# Patient Record
Sex: Male | Born: 1955 | Race: White | Hispanic: No | Marital: Married | Smoking: Former smoker
Health system: Southern US, Community
[De-identification: ages and names within clinical notes are randomized; demographics above are authoritative.]

## PROBLEM LIST (undated history)

## (undated) DIAGNOSIS — F419 Anxiety disorder, unspecified: Secondary | ICD-10-CM

## (undated) DIAGNOSIS — L980 Pyogenic granuloma: Secondary | ICD-10-CM

## (undated) DIAGNOSIS — L0390 Cellulitis, unspecified: Secondary | ICD-10-CM

## (undated) DIAGNOSIS — J189 Pneumonia, unspecified organism: Secondary | ICD-10-CM

## (undated) DIAGNOSIS — E669 Obesity, unspecified: Secondary | ICD-10-CM

## (undated) DIAGNOSIS — M1711 Unilateral primary osteoarthritis, right knee: Secondary | ICD-10-CM

## (undated) DIAGNOSIS — I10 Essential (primary) hypertension: Secondary | ICD-10-CM

## (undated) DIAGNOSIS — D649 Anemia, unspecified: Secondary | ICD-10-CM

## (undated) DIAGNOSIS — J45909 Unspecified asthma, uncomplicated: Secondary | ICD-10-CM

## (undated) DIAGNOSIS — G4730 Sleep apnea, unspecified: Secondary | ICD-10-CM

## (undated) DIAGNOSIS — R519 Headache, unspecified: Secondary | ICD-10-CM

## (undated) DIAGNOSIS — E119 Type 2 diabetes mellitus without complications: Secondary | ICD-10-CM

## (undated) DIAGNOSIS — R0600 Dyspnea, unspecified: Secondary | ICD-10-CM

## (undated) HISTORY — DX: Sleep apnea, unspecified: G47.30

## (undated) HISTORY — DX: Anxiety disorder, unspecified: F41.9

## (undated) HISTORY — DX: Cellulitis, unspecified: L03.90

---

## 1992-08-21 HISTORY — PX: COLON SURGERY: SHX602

## 2007-08-22 HISTORY — PX: HYDROCELE EXCISION / REPAIR: SUR1145

## 2007-08-22 HISTORY — PX: VASECTOMY: SHX75

## 2007-11-24 ENCOUNTER — Emergency Department: Payer: Self-pay | Admitting: Emergency Medicine

## 2007-12-17 ENCOUNTER — Ambulatory Visit: Payer: Self-pay | Admitting: Professional

## 2007-12-24 ENCOUNTER — Ambulatory Visit: Payer: Self-pay | Admitting: Professional

## 2007-12-31 ENCOUNTER — Ambulatory Visit: Payer: Self-pay | Admitting: Professional

## 2008-02-19 ENCOUNTER — Ambulatory Visit: Payer: Self-pay | Admitting: Gastroenterology

## 2008-09-25 ENCOUNTER — Inpatient Hospital Stay: Payer: Self-pay | Admitting: Internal Medicine

## 2009-11-01 ENCOUNTER — Emergency Department: Payer: Self-pay | Admitting: Unknown Physician Specialty

## 2010-09-12 ENCOUNTER — Ambulatory Visit: Payer: Self-pay | Admitting: Internal Medicine

## 2010-09-18 ENCOUNTER — Observation Stay: Payer: Self-pay | Admitting: Internal Medicine

## 2010-09-19 DIAGNOSIS — G459 Transient cerebral ischemic attack, unspecified: Secondary | ICD-10-CM

## 2011-11-09 ENCOUNTER — Observation Stay: Payer: Self-pay | Admitting: Internal Medicine

## 2011-11-09 LAB — PROTIME-INR
INR: 1
Prothrombin Time: 13.7 secs (ref 11.5–14.7)

## 2011-11-09 LAB — COMPREHENSIVE METABOLIC PANEL
Albumin: 3.4 g/dL (ref 3.4–5.0)
Alkaline Phosphatase: 63 U/L (ref 50–136)
BUN: 17 mg/dL (ref 7–18)
Bilirubin,Total: 0.3 mg/dL (ref 0.2–1.0)
Calcium, Total: 8.3 mg/dL — ABNORMAL LOW (ref 8.5–10.1)
Co2: 19 mmol/L — ABNORMAL LOW (ref 21–32)
Creatinine: 1.24 mg/dL (ref 0.60–1.30)
EGFR (Non-African Amer.): >60
Glucose: 180 mg/dL — ABNORMAL HIGH (ref 65–99)
Osmolality: 291 (ref 275–301)
SGPT (ALT): 34 U/L
Sodium: 143 mmol/L (ref 136–145)

## 2011-11-09 LAB — CBC
HCT: 42 % (ref 40.0–52.0)
HGB: 14.3 g/dL (ref 13.0–18.0)
MCH: 31.4 pg (ref 26.0–34.0)
MCHC: 34.1 g/dL (ref 32.0–36.0)
WBC: 11.9 10*3/uL — ABNORMAL HIGH (ref 3.8–10.6)

## 2011-11-09 LAB — HEMOGLOBIN A1C: Hemoglobin A1C: 6.2 % (ref 4.2–6.3)

## 2011-11-09 LAB — LIPASE, BLOOD: Lipase: 394 U/L — ABNORMAL HIGH (ref 73–393)

## 2011-11-09 LAB — HEMOGLOBIN: HGB: 12.5 g/dL — ABNORMAL LOW (ref 13.0–18.0)

## 2013-12-01 ENCOUNTER — Ambulatory Visit: Payer: Self-pay | Admitting: Gastroenterology

## 2013-12-02 LAB — PATHOLOGY REPORT

## 2014-12-13 NOTE — Discharge Summary (Signed)
PATIENT NAME:  Mathew Porter, Mathew Porter MR#:  [id number] DATE OF BIRTH:  June 25, 1956  DATE OF ADMISSION:  11/09/2011 DATE OF DISCHARGE:  11/09/2011  PRIMARY CARE PHYSICIAN: Rhona LeavensJames F. Burnett ShengHedrick, MD   DISCHARGE DIAGNOSES:   1. Rectal bleeding, most likely diverticular.  2. Hypotension, which resolved.  3. Hyperlipidemia.  4. Impaired fasting glucose.   MEDICATIONS ON DISCHARGE: Medications on discharge included his usual medications of: 1. Simvastatin 20 mg daily.  2. Verapamil ER 240 mg daily.  3. Vyvanse 20 mg daily.   NOTE: The patient was advised not to take aspirin or NSAIDs such as Advil, Motrin or Aleve.   HOSPITAL COURSE: The patient was admitted 11/09/2011. He signed out AGAINST MEDICAL ADVICE on 11/09/2011. He was admitted with rectal bleeding. Gastroenterology consultation was done by Dr. Mechele CollinElliott. The patient had Porter flexible sigmoidoscopy the same day which showed clotted blood found in the rectum, dark multiple clots, internal hemorrhoids, with Dr. Earnest ConroyElliott's advice to keep the patient overnight on Porter clear liquid diet and advance the diet slowly, watch the hemoglobin, and possibly discharge the next day in the evening. The patient did not want to do this. He wanted to leave the same day as the procedure. This was done AGAINST MEDICAL ADVICE because his hemoglobin did drop down two points. The patient understands the risks and did not want to stay any further and signed out the AGAINST MEDICAL ADVICE paperwork. He was still advised not to take aspirin or anti-inflammatories and follow up with his medical doctor.   HOSPITAL LABORATORY, DIAGNOSTIC AND RADIOLOGICAL DATA:  INR 1.0. Lipase 394. PTT 28.5.  White blood cell count 11.9, hemoglobin and hematocrit 14.3 and 42.0, platelet count of 233. Glucose 180, BUN 17, creatinine 1.24, sodium 143, potassium 4.1, chloride 110, CO2 19, calcium 8.3.  Liver function tests normal.  Chest x-ray: No acute disease in the chest.  Hemoglobin A1c 6.2. Repeat  hemoglobin 12.5.   Again, the patient signed out AGAINST MEDICAL ADVICE.   TIME SPENT ON PATIENT CARE: 30 minutes.   ____________________________ Herschell Dimesichard J. Renae GlossWieting, MD rjw:cbb D: 11/10/2011 17:40:11 ET T: 11/11/2011 16:35:38 ET JOB#: [id number]  cc: Herschell Dimesichard J. Renae GlossWieting, MD, <Dictator> Rhona LeavensJames F. Burnett ShengHedrick, MD Salley ScarletICHARD J Mayson Sterbenz MD ELECTRONICALLY SIGNED 11/15/2011 20:59

## 2014-12-13 NOTE — Consult Note (Signed)
Brief Consult Note: Diagnosis: LGI bleed painless 3 hour duration last eve after normal stools. Etiology is likely diverticular bleed, possible IH, but would be unlikely to have 3 hours of rectal bleeding from hemorrhoids, known hx of adenomatous polyps.   Patient was seen by consultant.   Consult note dictated.   Comments: DRE by me red blood formed pieces of stool, non tender anal opening.Did not penetrate far.  Non-tender abdomen. Prior colonoscopy 2009 for f/u polyps revealed multiple small mouthed diverticulosis, polyps, internal hemorrhoids.Prior colon resection for diverticulitis.  Pt wants to go home. Hgb on admission normal, repeat just drawn. Plan: NPO, Flexible sigmoidoscopy this afternoon to r/o IH vs diverticular tics as cause of bleeding. Case d/w Dr. Mechele CollinElliott in collaboration of care. Hgb returns 2 grams lower.  Electronic Signatures: Mathew Porter, Mathew Porter (NP)  (Signed 21-Mar-13 13:57)  Authored: Brief Consult Note   Last Updated: 21-Mar-13 13:57 by Mathew Porter, Tamecka Milham Porter (NP)

## 2014-12-13 NOTE — H&P (Signed)
PATIENT NAME:  Mathew Porter, Mathew Porter MR#:  [id number] DATE OF BIRTH:  Nov 09, 1955  DATE OF ADMISSION:  11/09/2011  PRIMARY CARE PHYSICIAN: Dr. Jerl Mina. ED REFERRING PHYSICIAN: Dr. Ladona Ridgel.   CHIEF COMPLAINT: Bright red blood per rectum.   HISTORY OF PRESENT ILLNESS: The patient is a 59 year old white male with previous history of having internal hemorrhoids. He reports that once a year he has bleeding by his internal hemorrhoids. This was similar to that, but it was much more severe. He reports that the bright red blood started around 7:00 p.m. yesterday evening and then continued until 10:00 p.m. He came to the ED. He otherwise has not had any abdominal pain, nausea, vomiting, or diarrhea. He denies any hematemesis. No shortness of breath. No dizziness. Otherwise, he denies no chest pains or palpitations.   PAST MEDICAL HISTORY:  1. History of diverticulitis in the past.  2. History of hyperlipidemia.  3. Hypertension.   PAST SURGICAL HISTORY: Status post bowel surgery for diverticulitis.   ALLERGIES: None.   CURRENT MEDICATIONS:  1. Vyvanse 20 mg daily.  2. Verapamil 240 daily.  3. Simvastatin 20 daily.   SOCIAL HISTORY: He smokes 1 pack per day. No alcohol. No drug use.   FAMILY HISTORY: Positive for hypertension.   REVIEW OF SYSTEMS: CONSTITUTIONAL: Denies any fevers, fatigue, weakness, pain, weight loss, or weight gain. EYES: No blurred or double vision. No pain. No redness. No inflammation. No glaucoma. ENT: No tinnitus. No ear pain. No hearing loss. No seasonal or year-round allergies. No epistaxis. No nasal discharge. No difficulty swallowing. RESPIRATORY: No cough. No wheezing. No hemoptysis. No dyspnea. No chronic obstructive pulmonary disease. No tuberculosis. No pneumonia. CARDIOVASCULAR: No chest pain. No orthopnea. No edema. No arrhythmia. No palpitations. No syncope. GASTROINTESTINAL: No nausea, vomiting, or diarrhea. No abdominal pain. No hematemesis. No ulcers. No  gastroesophageal reflux disease. No irritable bowel syndrome. No jaundice. No changes in bowel habits. GENITOURINARY: Denies any dysuria, hematuria, renal calculus, or frequency. ENDOCRINE: Denies any polydipsia, nocturia, or thyroid problems. No increase in sweating, heat or cold intolerance. HEME/LYMPH: Denies anemia, easy bruisability, or bleeding. SKIN: No acne. No rash. No changes in mole, hair or skin. MUSCULOSKELETAL: Denies any pain in the neck, back, or shoulder. NEUROLOGIC: No numbness. No weakness. No cerebrovascular accident. No transient ischemic attacks. No seizures.   PHYSICAL EXAMINATION:  VITAL SIGNS: Temperature 97, pulse 80, respirations 16, blood pressure was initially 123/72, recently was 90/52.   GENERAL: The patient is a morbidly obese male in no acute distress.   HEENT: Head atraumatic, normocephalic. Pupils equally round and reactive to light and accommodation. Extraocular movements intact. Oropharynx: Clear without any exudates.   NECK: There is no thyromegaly. No carotid bruits.   CARDIOVASCULAR: Regular rate and rhythm. No murmurs, rubs, clicks, or gallops. PMI is not displaced.   LUNGS: No accessory muscle usage. No rales, rhonchi, or wheezing.   ABDOMEN: Soft, nontender, nondistended. Positive bowel sounds x4.   EXTREMITIES: No clubbing, cyanosis, or edema.   NEUROLOGIC: Awake, alert, oriented x3. No focal deficits.   SKIN: No rash.   LYMPHATICS: No lymph nodes palpable.   VASCULAR: Good DP, PT pulses.   NEUROLOGICAL: Awake, alert, oriented x3. No focal deficits.   LABORATORY, RADIOLOGICAL AND DIAGNOSTIC DATA: Blood glucose 180, BUN 17, creatinine 1.24, sodium 143, potassium 4.1, chloride 110, CO2 19, calcium 8.3. Lipase 394. LFTs were normal. WBC slightly elevated at 11.9, hemoglobin 14.3, INR 1.07.   ASSESSMENT AND PLAN: The patient is a  59 year old white male with history of internal hemorrhoids who presents with bright red blood per rectum.  1. Bright  red blood per rectum likely due to internal hemorrhoids. Other differential diagnoses include possible diverticular bleed. At this time we will place the patient on observation as hemoglobin is currently stable. We will recheck his hemoglobin later today. We will have gastroenterology evaluate the patient. If no further bleed, likely may be discharged later today.  2. Hyperlipidemia. Continue simvastatin.  3. Hypertension, was a little hypotensive here, will hold Verapamil.  4. Hyperglycemia. We will check a hemoglobin A1c, possible new onset diabetes.  5. Leukocytosis, likely reactive in nature.  6. Slightly elevated lipase of unclear etiology. Will need to have this followed up.  7. Miscellaneous. The patient is ambulatory.    TIME SPENT: 35 minutes.   ____________________________ Lacie ScottsShreyang H. Allena KatzPatel, MD shp:ap D: 11/09/2011 05:55:47 ET T: 11/09/2011 07:13:50 ET JOB#: [id number]  cc: Jaydalyn Demattia H. Allena KatzPatel, MD, <Dictator> Rhona LeavensJames F. Burnett ShengHedrick, MD Charise CarwinSHREYANG H Tylasia Fletchall MD ELECTRONICALLY SIGNED 11/20/2011 13:24

## 2014-12-13 NOTE — Consult Note (Signed)
Flex sig done to recto-sigmoid area showing dark clots of blood scattered throughout the rectum.  This makes it likely this is diverticular bleed which appears to have stopped.  Discussed with hospitalist and patient.  He wants to leave the hospital but this may be a wrong decision and may sign out AMA.  I recommended to patient to stay to tomorrow evening.  What he will do is unknown.   Electronic Signatures: Scot JunElliott, Errin Chewning T (MD)  (Signed on 21-Mar-13 17:47)  Authored  Last Updated: 21-Mar-13 17:47 by Scot JunElliott, Vayda Dungee T (MD)

## 2014-12-13 NOTE — Consult Note (Signed)
PATIENT NAME:  Mathew CleverlyCOLLINS, Sathvik A MR#:  [id number] DATE OF BIRTH:  1955-11-18  DATE OF CONSULTATION:  11/09/2011  REFERRING PHYSICIAN:  S. Allena KatzPatel, MD   CONSULTING PHYSICIAN: Lynnae Prudeobert Elliott, MD/Adamaris King Arvilla MarketMills, ANP   REASON FOR CONSULTATION: GI bleed.   HISTORY OF PRESENT ILLNESS: The 59 year old patient with history of internal hemorrhoids, diverticulosis, adenomatous colon polyps, and previous lower gastrointestinal bleed presented to the hospital for rectal bleeding. The patient states he had a normal bowel movement yesterday morning about 7:30 in the morning. He worked, came home from work, had another normal stool at about 7:00 p.m., although he admits he did not really look at it. Twenty minutes later, he was back to the bathroom, and he says he had a diarrhea-like passage but it was just bright red blood. He reports multiple movements for about three hours. Since he decided it was not stopping, he presented to the Emergency Room; and he says he has had no further rectal bleeding. The patient denied any abdominal pain with this, nausea, vomiting, dizziness, lightheadedness, or weakness. Hemoglobin on arrival was normal. He has been eating full meals today. He says he feels fine and is looking forward to being discharged.   The patient does have a history of rectal bleeding evaluated in the 90s, had diverticulitis and bowel surgery for diverticulitis. He has also had a history of adenomatous colon polyps, and most recent colonoscopy performed in 2009 revealed diverticulosis, multiple small mouthed diverticula found in the entire colon. There was a pedunculated 4 mm polyp in the cecum and three 2 to 3 mm polyps in the distal sigmoid colon. There was a patent end-to-end colocolonic anastomosis. Internal nonbleeding small hemorrhoids were found. Pathology returned tubular adenoma and hyperplastic polyp. The patient is due for repeat surveillance colonoscopy in 2014.   PAST MEDICAL HISTORY:   1. Hypertension.  2. Diverticulosis/diverticulitis.  3. Adenomatous colon polyposis.  4. Headaches.  5. Hyperlipidemia. 6. Central obesity. 7. Obstructive sleep apnea. 8. History of depression. PAST SURGICAL HISTORY: Colon resection for diverticulitis in 1991.   MEDICATIONS ON ARRIVAL:  1. Vyvanse 20 mg daily.  2. Verapamil 240 mg daily.  3. Simvastatin 20 mg daily.   ALLERGIES: No known drug allergies.   HABITS: Positive tobacco 1 pack per day. Negative alcohol or illicit drug use.   FAMILY HISTORY: Remains negative for colorectal polyp or neoplasm.   REVIEW OF SYSTEMS: Ten systems are reviewed, negative with the exception of lower gastrointestinal bleed history as noted.   PHYSICAL EXAMINATION:  VITAL SIGNS: 97.9, heart rate 76, pulse 20, blood pressure 111/72, pulse oximetry on room air is 97%.   GENERAL: A well-appearing obese Caucasian male resting in bed looking at his IPAD.    HEENT: Head is normocephalic. Conjunctiva is pink. Sclera is anicteric. Oral mucosa is moist and intact. Tongue is coated.   NECK: Supple without lymphadenopathy. Trachea midline.   HEART: Heart tones S1, S2 without murmur or gallop.   LUNGS: Clear to auscultation. Respirations are eupneic.   ABDOMEN: Soft, nontender. Positive bowel sounds.   RECTAL: Digital rectal exam by me shows fresh red blood, there are little pieces of formed stool. No significant pain or tenderness around the anus to suggest fissure or tear. Deep penetration was not performed to examine walls.   EXTREMITIES: Lower extremities without edema, cyanosis, or clubbing.   SKIN: Warm and dry without rash.   PSYCHIATRIC: The patient is alert, oriented, interacting appropriately. He voices that he would like to be discharged  from observation bed and go home today.   NEUROLOGIC: Cranial nerves II through XII are grossly intact.   LABORATORY, DIAGNOSTIC AND RADIOLOGICAL DATA:  Admission blood work with glucose 180, BUN 17,  creatinine 1.24, lipase 394.  Hemoglobin A1c 6.2.  Liver panel normal.  WBC 11.9, hemoglobin 14.3, repeated at 12.5.  Pro time is 13.7, INR 1.0, PTT 28.5.  Portable chest x-ray, single view, performed 11/09/2010 was normal.   IMPRESSION: The patient presents with lower GI bleed, acute onset painless, etiology likely is diverticular in nature. He had a history of colon resection for diverticulitis in approximately 1991. He has had colonoscopies for adenomatous colon polyps, most recent in 2009. He had a hospital admission in 2010 for lower gastrointestinal bleed, suspected diverticular in nature. He presents now with a similar history. Hemoglobin on admission was 14.3, and on repeat now at 12.5. The patient has also had slight leukocytosis. He is asymptomatic as far as no abdominal pain, and admission lipase was performed, 394. The patient has not had any pain with this event. He presents now tolerating a regular diet and requesting discharge. The patient is expecting the etiology to be internal hemorrhoids. He reports intermittently through the years he has had this type of bright red blood. The difference is this one lasted three hours, and that was the reason he came to the hospital.   PLAN: We will make him n.p.o., Dr. Mechele Collin wants to do a flexible sigmoidoscopy this afternoon. The patient is due for a full colonoscopy next year, and that will need to be obtained at some point in time as well. I would recommend continuing to follow hemoglobin and to monitor the WBC. Through [facility] review, the patient has had leukocytosis and is to be monitored. No abdominal pain, fevers to suggest diverticulitis at this time. We will continue to follow the patient. The case was discussed with Dr. Mechele Collin in collaboration of care.       These services provided by Cala Bradford A. Arvilla Market, MS, APRN, BC, ANP, under  collaborative agreement with Scot Jun, MD.   ____________________________ Ranae Plumber Arvilla Market,  ANP kam:cbb D: 11/09/2011 14:10:51 ET T: 11/09/2011 15:47:30 ET JOB#: [id number]  cc: Cala Bradford A. Arvilla Market, ANP, <Dictator> Ranae Plumber. Suzette Battiest, MSN, ANP-BC Adult Nurse Practitioner ELECTRONICALLY SIGNED 11/21/2011 18:22

## 2015-12-21 DIAGNOSIS — Z6841 Body Mass Index (BMI) 40.0 and over, adult: Secondary | ICD-10-CM

## 2018-05-23 ENCOUNTER — Encounter: Payer: Self-pay | Admitting: Urology

## 2018-05-23 ENCOUNTER — Ambulatory Visit (INDEPENDENT_AMBULATORY_CARE_PROVIDER_SITE_OTHER): Payer: 59 | Admitting: Urology

## 2018-05-23 VITALS — BP 110/72 | HR 103 | Ht 71.0 in | Wt 330.3 lb

## 2018-05-23 DIAGNOSIS — K635 Polyp of colon: Secondary | ICD-10-CM | POA: Insufficient documentation

## 2018-05-23 DIAGNOSIS — F419 Anxiety disorder, unspecified: Secondary | ICD-10-CM | POA: Insufficient documentation

## 2018-05-23 DIAGNOSIS — N3281 Overactive bladder: Secondary | ICD-10-CM | POA: Diagnosis not present

## 2018-05-23 DIAGNOSIS — I10 Essential (primary) hypertension: Secondary | ICD-10-CM | POA: Insufficient documentation

## 2018-05-23 DIAGNOSIS — R519 Headache, unspecified: Secondary | ICD-10-CM | POA: Insufficient documentation

## 2018-05-23 DIAGNOSIS — K5790 Diverticulosis of intestine, part unspecified, without perforation or abscess without bleeding: Secondary | ICD-10-CM | POA: Insufficient documentation

## 2018-05-23 DIAGNOSIS — B019 Varicella without complication: Secondary | ICD-10-CM | POA: Insufficient documentation

## 2018-05-23 DIAGNOSIS — R51 Headache: Secondary | ICD-10-CM

## 2018-05-23 LAB — URINALYSIS, COMPLETE
BILIRUBIN UA: NEGATIVE
Glucose, UA: NEGATIVE
Ketones, UA: NEGATIVE
Leukocytes, UA: NEGATIVE
NITRITE UA: NEGATIVE
Protein, UA: NEGATIVE
RBC UA: NEGATIVE
Specific Gravity, UA: >1.03 — ABNORMAL HIGH (ref 1.005–1.030)
UUROB: 0.2 mg/dL (ref 0.2–1.0)
pH, UA: 5 (ref 5.0–7.5)

## 2018-05-23 LAB — MICROSCOPIC EXAMINATION
BACTERIA UA: NONE SEEN
Epithelial Cells (non renal): NONE SEEN /hpf (ref 0–10)
RBC MICROSCOPIC, UA: NONE SEEN /HPF (ref 0–2)
WBC, UA: NONE SEEN /hpf (ref 0–5)

## 2018-05-23 MED ORDER — OXYBUTYNIN CHLORIDE ER 10 MG PO TB24: 10.0000 mg | ORAL_TABLET | Freq: Every day | ORAL | 11 refills | Status: DC

## 2018-05-23 NOTE — Progress Notes (Signed)
05/23/2018 11:12 AM   Shellia Cleverly 1955/08/23 [id number]  Referring provider: Simonne Martinet, MD [address] [place], [place] [address]  CC: Urinary frequency  HPI: I saw Mr. Staniszewski in urology clinic today in consultation for urinary frequency from Dr. Chalmers Guest.  He is a 62 year old morbidly obese male with 60 pounds of weight gain over the last 4 to 6 months who presents with chief complaint of primarily urinary frequency, including nocturia 4-5 times per night.  He also reports mild to moderate urgency, and occasional urge incontinence.  He denies weak stream, dribbling, or feeling of incomplete emptying.  He has had difficulty with constipation lately with very hard stools.  He denies gross hematuria or flank pain.  He also has diagnosed sleep apnea, but he has not been using his CPAP machine.  He had two urine cultures this summer that were negative, however he was treated for presumed prostatitis with antibiotics.  He does not feel that the antibiotics improved his urinary symptoms.  PVR 70 cc in clinic today.  IPSS score is 11, unhappy.   PMH: Past Medical History:  Diagnosis Date  . Anxiety   . Sleep apnea     Surgical History: Past Surgical History:  Procedure Laterality Date  . COLON SURGERY  1994   Diverticulitis  . HYDROCELE EXCISION / REPAIR  2009  . VASECTOMY  2009    Allergies: No Known Allergies  Family History: Family History  Problem Relation Age of Onset  . Lung cancer Father     Social History:  reports that he quit smoking about 3 years ago. His smoking use included cigarettes. He quit after 40.00 years of use. He has never used smokeless tobacco. He reports that he drank alcohol. He reports that he does not use drugs.  ROS: Please see flowsheet from today's date for complete review of systems.  Physical Exam: BP 110/72 (BP Location: Left Arm, Patient Position: Sitting, Cuff Size: Normal)   Pulse (!) 103   Ht 5\' 11"  (1.803 m)    Wt (!) 330 lb 4.8 oz (149.8 kg)   BMI 46.07 kg/m    Constitutional: Morbidly obese, alert and oriented, No acute distress. Cardiovascular: No clubbing, cyanosis, or edema. Respiratory: Normal respiratory effort, no increased work of breathing. GI: Abdomen is soft, nontender, nondistended GU: No CVA tenderness Lymph: No cervical or inguinal lymphadenopathy. Skin: No rashes, bruises or suspicious lesions. Neurologic: Grossly intact, no focal deficits, moving all 4 extremities. Psychiatric: Normal mood and affect.  Laboratory Data: Urinalysis today 0 WBCs, 0 RBCs, no bacteria, nitrite negative  Pertinent Imaging: None to review  Assessment & Plan:   In summary, Mr. Soy is a morbidly obese 62 year old male with constipation, poorly managed sleep apnea, non-compliant with CPAP, with multiple negative urine cultures, who presents with 2 to 16-month history of urgency, frequency, and nocturia.  We had a long discussion that I feel very strongly that the symptoms are secondary to his significant weight gain, poorly managed sleep apnea, and constipation.  I strongly counseled him that behavioral modifications including weight loss, compliance with CPAP, and management of his constipation will likely improve his symptoms.  We elected to defer PSA screening today in the setting of his comorbidities.  We will also trial an anticholinergic Ditropan XL 10 mg daily for his overactive symptoms.  Trial of Ditropan XL RTC with Juanell Fairly with PVR in 3 months  [facility] [address] [place], [place] [address] [phone]  

## 2018-05-23 NOTE — Patient Instructions (Signed)

## 2018-08-26 NOTE — Progress Notes (Signed)
08/27/2018 9:33 AM   Mathew Porter 1956-01-02 [id number]  Referring provider: Jerl MinaHedrick, James, MD [address] [facility] [place], [place] [id number]  CC: Urinary frequency  HPI: Patient is a 63 year old Caucasian male with OAB who presents today for a three month follow up after a trial of oxybutynin.  Background history  Mathew Porter was this patient in consultation for urinary frequency from Mathew Porter.  He is a 63 year old morbidly obese male with 60 pounds of weight gain over the last 4 to 6 months who presents with chief complaint of primarily urinary frequency, including nocturia 4-5 times per night.  He also reports mild to moderate urgency, and occasional urge incontinence.  He denies weak stream, dribbling, or feeling of incomplete emptying.  He has had difficulty with constipation lately with very hard stools.  He denies gross hematuria or flank pain.  He also has diagnosed sleep apnea, but he has not been using his CPAP machine.  He had two urine cultures this summer that were negative, however he was treated for presumed prostatitis with antibiotics.  He does not feel that the antibiotics improved his urinary symptoms.  PVR 70 cc in clinic today.  IPSS score is 11, unhappy.  BPH WITH LUTS  (prostate and/or bladder) IPSS score:  7/4    PVR: 0 mL   Previous score: 11/5  Previous PVR: 70 mL  Major complaint(s): frequency and nocturia x several months.  He states these symptoms started suddenly.  Denies any dysuria, hematuria or suprapubic pain.   Currently taking: oxybutynin XL 10 mg daily    Denies any recent fevers, chills, nausea or vomiting.  He is drinking 3 to 4 quarts of water daily.  He has an occasional Coke once or twice a week.  No coffee.  No tea.  Glass of milk in the am.  No alcohol.  No juices.  No energy drinks.    IPSS    Row Name 08/27/18 0900         International Prostate Symptom Score   How often have you had the sensation of not emptying  your bladder?  Not at All     How often have you had to urinate less than every two hours?  About half the time     How often have you found you stopped and started again several times when you urinated?  Not at All     How often have you found it difficult to postpone urination?  Not at All     How often have you had a weak urinary stream?  Not at All     How often have you had to strain to start urination?  Not at All     How many times did you typically get up at night to urinate?  4 Times     Total IPSS Score  7       Quality of Life due to urinary symptoms   If you were to spend the rest of your life with your urinary condition just the way it is now how would you feel about that?  Mostly Disatisfied        Score:  1-7 Mild 8-19 Moderate 20-35 Severe    PMH: Past Medical History:  Diagnosis Date  . Anxiety   . Sleep apnea     Surgical History: Past Surgical History:  Procedure Laterality Date  . COLON SURGERY  1994   Diverticulitis  . HYDROCELE  EXCISION / REPAIR  2009  . VASECTOMY  2009    Allergies: No Known Allergies  Family History: Family History  Problem Relation Age of Onset  . Lung cancer Father     Social History:  reports that he quit smoking about 4 years ago. His smoking use included cigarettes. He quit after 40.00 years of use. He has never used smokeless tobacco. He reports previous alcohol use. He reports that he does not use drugs.  ROS: Please see flowsheet from today's date for complete review of systems.  Physical Exam: BP (!) 158/95   Pulse 97   Ht 5\' 11"  (1.803 m)   Wt (!) 338 lb (153.3 kg)   BMI 47.14 kg/m    Constitutional:  Well nourished. Alert and oriented, No acute distress. HEENT: [place] AT, moist mucus membranes.  Trachea midline, no masses. Cardiovascular: No clubbing, cyanosis, or edema. Respiratory: Normal respiratory effort, no increased work of breathing. GI: Abdomen is obese, with umbilical hernia.    GU: No CVA  tenderness.  No bladder fullness or masses.  Patient with uncircumcised phallus.  Foreskin easily retracted.   Urethral meatus is patent.  No penile discharge. No penile lesions or rashes. Scrotum without lesions, cysts, rashes and/or edema.  Testicles are located scrotally bilaterally. No masses are appreciated in the testicles. Left and right epididymis are normal. Rectal: Patient with  normal sphincter tone. Anus and perineum without scarring or rashes. No rectal masses are appreciated. Prostate is approximately 50  grams, could only palpate the apex and midportion, no nodules are appreciated. Skin: No rashes, bruises or suspicious lesions. Lymph: No cervical or inguinal adenopathy. Neurologic: Grossly intact, no focal deficits, moving all 4 extremities. Psychiatric: Normal mood and affect.  Pertinent Imaging: Results for SILVESTRE, WADDY (MRN [id number]) as of 08/27/2018 09:41  Ref. Range 08/27/2018 09:11  Scan Result Unknown 0    Assessment & Plan:    1. OAB - Discussed behavioral therapies, bladder training and bladder control strategies - encouraged patient to lose weight - offered referral to nutritionist but patient declined - will increase oxybutynin XL to 15 mg daily from 10 mg - RTC in 3 months for I PSS and PVR  2. Nocturia - I explained to the patient that nocturia is often multi-factorial and difficult to treat.  Sleeping disorders, heart conditions, peripheral vascular disease, diabetes, an enlarged prostate for men, an urethral stricture causing bladder outlet obstruction and/or certain medications can contribute to nocturia. - I have suggested that the patient avoid caffeine after noon and alcohol in the evening.  He or she may also benefit from fluid restrictions after 6:00 in the evening and voiding just prior to bedtime. - I have explained that research studies have showed that over 84% of patients with sleep apnea reported frequent nighttime urination.   With sleep apnea,  oxygen decreases, carbon dioxide increases, the blood become more acidic, the heart rate drops and blood vessels in the lung constrict.  The body is then alerted that something is very wrong. The sleeper must wake enough to reopen the airway. By this time, the heart is racing and experiences a false signal of fluid overload. The heart excretes a hormone-like protein that tells the body to get rid of sodium and water, resulting in nocturia. - he has a history of sleep apnea and had not slept with his CPAP in over two years - I have advised him to have a repeated sleep study and fitted with a new CPAP  3. Constipation - patient is still having issues - states he was told in was due to his large prostate - explained it is not likely do to his prostate, but likely due to his low fiber intake - encouraged the patient to eat 5 servings of non-starchy veggies daily - suggested taking MiraLAX as needed   [facility] [address], [address] [place], [place] [address] [phone]

## 2018-08-27 ENCOUNTER — Ambulatory Visit (INDEPENDENT_AMBULATORY_CARE_PROVIDER_SITE_OTHER): Payer: Managed Care, Other (non HMO) | Admitting: Urology

## 2018-08-27 ENCOUNTER — Encounter: Payer: Self-pay | Admitting: Urology

## 2018-08-27 ENCOUNTER — Other Ambulatory Visit: Payer: Self-pay

## 2018-08-27 VITALS — BP 158/95 | HR 97 | Ht 71.0 in | Wt 338.0 lb

## 2018-08-27 DIAGNOSIS — R351 Nocturia: Secondary | ICD-10-CM

## 2018-08-27 DIAGNOSIS — N3281 Overactive bladder: Secondary | ICD-10-CM

## 2018-08-27 DIAGNOSIS — K5909 Other constipation: Secondary | ICD-10-CM

## 2018-08-27 LAB — BLADDER SCAN AMB NON-IMAGING: SCAN RESULT: 0

## 2018-08-27 MED ORDER — OXYBUTYNIN CHLORIDE ER 15 MG PO TB24: 15.0000 mg | ORAL_TABLET | Freq: Every day | ORAL | 0 refills | Status: DC

## 2018-11-05 ENCOUNTER — Other Ambulatory Visit: Payer: Self-pay

## 2018-11-05 ENCOUNTER — Inpatient Hospital Stay
Admission: EM | Admit: 2018-11-05 | Discharge: 2018-11-08 | DRG: 871 | Disposition: A | Discharge status: Home / Self Care | Payer: Managed Care, Other (non HMO) | Attending: Internal Medicine | Admitting: Internal Medicine

## 2018-11-05 ENCOUNTER — Emergency Department: Payer: Managed Care, Other (non HMO)

## 2018-11-05 ENCOUNTER — Encounter: Payer: Self-pay | Admitting: Internal Medicine

## 2018-11-05 DIAGNOSIS — I10 Essential (primary) hypertension: Secondary | ICD-10-CM | POA: Diagnosis present

## 2018-11-05 DIAGNOSIS — Z6841 Body Mass Index (BMI) 40.0 and over, adult: Secondary | ICD-10-CM | POA: Diagnosis not present

## 2018-11-05 DIAGNOSIS — J209 Acute bronchitis, unspecified: Secondary | ICD-10-CM | POA: Diagnosis present

## 2018-11-05 DIAGNOSIS — G43909 Migraine, unspecified, not intractable, without status migrainosus: Secondary | ICD-10-CM | POA: Diagnosis present

## 2018-11-05 DIAGNOSIS — L03116 Cellulitis of left lower limb: Secondary | ICD-10-CM | POA: Diagnosis present

## 2018-11-05 DIAGNOSIS — Z801 Family history of malignant neoplasm of trachea, bronchus and lung: Secondary | ICD-10-CM | POA: Diagnosis not present

## 2018-11-05 DIAGNOSIS — G4730 Sleep apnea, unspecified: Secondary | ICD-10-CM | POA: Diagnosis present

## 2018-11-05 DIAGNOSIS — R0902 Hypoxemia: Secondary | ICD-10-CM

## 2018-11-05 DIAGNOSIS — Z87891 Personal history of nicotine dependence: Secondary | ICD-10-CM | POA: Diagnosis not present

## 2018-11-05 DIAGNOSIS — E119 Type 2 diabetes mellitus without complications: Secondary | ICD-10-CM | POA: Diagnosis present

## 2018-11-05 DIAGNOSIS — A419 Sepsis, unspecified organism: Secondary | ICD-10-CM

## 2018-11-05 DIAGNOSIS — R0602 Shortness of breath: Secondary | ICD-10-CM | POA: Diagnosis not present

## 2018-11-05 DIAGNOSIS — Z79899 Other long term (current) drug therapy: Secondary | ICD-10-CM

## 2018-11-05 DIAGNOSIS — J189 Pneumonia, unspecified organism: Secondary | ICD-10-CM | POA: Diagnosis not present

## 2018-11-05 HISTORY — DX: Essential (primary) hypertension: I10

## 2018-11-05 HISTORY — DX: Obesity, unspecified: E66.9

## 2018-11-05 LAB — CBC WITH DIFFERENTIAL/PLATELET
Abs Immature Granulocytes: 0.17 10*3/uL — ABNORMAL HIGH (ref 0.00–0.07)
Basophils Absolute: 0 10*3/uL (ref 0.0–0.1)
Basophils Relative: 0 %
EOS PCT: 0 %
Eosinophils Absolute: 0 10*3/uL (ref 0.0–0.5)
HCT: 44.9 % (ref 39.0–52.0)
Hemoglobin: 14.9 g/dL (ref 13.0–17.0)
Immature Granulocytes: 1 %
Lymphocytes Relative: 6 %
Lymphs Abs: 1.1 10*3/uL (ref 0.7–4.0)
MCH: 29.2 pg (ref 26.0–34.0)
MCHC: 33.2 g/dL (ref 30.0–36.0)
MCV: 88 fL (ref 80.0–100.0)
Monocytes Absolute: 0.6 10*3/uL (ref 0.1–1.0)
Monocytes Relative: 3 %
Neutro Abs: 16.9 10*3/uL — ABNORMAL HIGH (ref 1.7–7.7)
Neutrophils Relative %: 90 %
PLATELETS: 232 10*3/uL (ref 150–400)
RBC: 5.1 MIL/uL (ref 4.22–5.81)
RDW: 14.5 % (ref 11.5–15.5)
WBC: 18.8 10*3/uL — ABNORMAL HIGH (ref 4.0–10.5)
nRBC: 0 % (ref 0.0–0.2)

## 2018-11-05 LAB — PROTIME-INR
INR: 1.1 (ref 0.8–1.2)
Prothrombin Time: 13.6 seconds (ref 11.4–15.2)

## 2018-11-05 LAB — TROPONIN I: Troponin I: <0.03 ng/mL (ref <0.03)

## 2018-11-05 LAB — BASIC METABOLIC PANEL
Anion gap: 13 (ref 5–15)
BUN: 15 mg/dL (ref 8–23)
CO2: 22 mmol/L (ref 22–32)
Calcium: 8.9 mg/dL (ref 8.9–10.3)
Chloride: 100 mmol/L (ref 98–111)
Creatinine, Ser: 1.06 mg/dL (ref 0.61–1.24)
GFR calc Af Amer: >60 mL/min (ref >60)
GFR calc non Af Amer: >60 mL/min (ref >60)
Glucose, Bld: 184 mg/dL — ABNORMAL HIGH (ref 70–99)
Potassium: 3.7 mmol/L (ref 3.5–5.1)
SODIUM: 135 mmol/L (ref 135–145)

## 2018-11-05 LAB — INFLUENZA PANEL BY PCR (TYPE A & B)
Influenza A By PCR: NEGATIVE
Influenza B By PCR: NEGATIVE

## 2018-11-05 LAB — FIBRIN DERIVATIVES D-DIMER (ARMC ONLY): Fibrin derivatives D-dimer (ARMC): 832.83 ng/mL (FEU) — ABNORMAL HIGH (ref 0.00–499.00)

## 2018-11-05 LAB — LACTIC ACID, PLASMA
Lactic Acid, Venous: 2 mmol/L (ref 0.5–1.9)
Lactic Acid, Venous: 2.1 mmol/L (ref 0.5–1.9)

## 2018-11-05 MED ORDER — SODIUM CHLORIDE 0.9 % IV BOLUS
1000.0000 mL | Freq: Once | INTRAVENOUS | Status: AC
  Administered 2018-11-05: 18:00:00 1000 mL via INTRAVENOUS

## 2018-11-05 MED ORDER — ENOXAPARIN SODIUM 40 MG/0.4ML ~~LOC~~ SOLN
40.0000 mg | SUBCUTANEOUS | Status: DC
  Administered 2018-11-05 – 2018-11-06 (×2): 40 mg via SUBCUTANEOUS
  Filled 2018-11-05 (×2): qty 0.4

## 2018-11-05 MED ORDER — ASPIRIN-ACETAMINOPHEN-CAFFEINE 250-250-65 MG PO TABS
1.0000 | ORAL_TABLET | Freq: Three times a day (TID) | ORAL | Status: DC | PRN
  Administered 2018-11-05 – 2018-11-07 (×3): 1 via ORAL
  Filled 2018-11-05 (×5): qty 1

## 2018-11-05 MED ORDER — VERAPAMIL HCL ER 240 MG PO TBCR
240.0000 mg | EXTENDED_RELEASE_TABLET | Freq: Every day | ORAL | Status: DC
  Administered 2018-11-05 – 2018-11-08 (×4): 240 mg via ORAL
  Filled 2018-11-05 (×4): qty 1

## 2018-11-05 MED ORDER — VANCOMYCIN HCL 10 G IV SOLR
1250.0000 mg | Freq: Two times a day (BID) | INTRAVENOUS | Status: DC
  Administered 2018-11-05 – 2018-11-08 (×6): 1250 mg via INTRAVENOUS
  Filled 2018-11-05 (×8): qty 1250

## 2018-11-05 MED ORDER — ONDANSETRON HCL 4 MG/2ML IJ SOLN: 4.0000 mg | Freq: Four times a day (QID) | INTRAMUSCULAR | Status: DC | PRN

## 2018-11-05 MED ORDER — SODIUM CHLORIDE 0.9 % IV SOLN
2.0000 g | Freq: Once | INTRAVENOUS | Status: AC
  Administered 2018-11-05: 2 g via INTRAVENOUS
  Filled 2018-11-05: qty 20

## 2018-11-05 MED ORDER — ATORVASTATIN CALCIUM 20 MG PO TABS
10.0000 mg | ORAL_TABLET | Freq: Every day | ORAL | Status: DC
  Administered 2018-11-06 – 2018-11-08 (×3): 10 mg via ORAL
  Filled 2018-11-05 (×3): qty 1

## 2018-11-05 MED ORDER — CALCIUM CARBONATE ANTACID 500 MG PO CHEW: 1.0000 | CHEWABLE_TABLET | Freq: Four times a day (QID) | ORAL | Status: DC | PRN

## 2018-11-05 MED ORDER — ACETAMINOPHEN 650 MG RE SUPP: 650.0000 mg | Freq: Four times a day (QID) | RECTAL | Status: DC | PRN

## 2018-11-05 MED ORDER — TAMSULOSIN HCL 0.4 MG PO CAPS
0.4000 mg | ORAL_CAPSULE | Freq: Every day | ORAL | Status: DC
  Administered 2018-11-06 – 2018-11-08 (×3): 0.4 mg via ORAL
  Filled 2018-11-05 (×3): qty 1

## 2018-11-05 MED ORDER — PANTOPRAZOLE SODIUM 40 MG PO TBEC
40.0000 mg | DELAYED_RELEASE_TABLET | Freq: Every day | ORAL | Status: DC
  Administered 2018-11-05 – 2018-11-08 (×4): 40 mg via ORAL
  Filled 2018-11-05 (×4): qty 1

## 2018-11-05 MED ORDER — SIMVASTATIN 20 MG PO TABS
20.0000 mg | ORAL_TABLET | Freq: Every day | ORAL | Status: DC
  Administered 2018-11-05: 20 mg via ORAL
  Filled 2018-11-05: qty 1

## 2018-11-05 MED ORDER — OXYBUTYNIN CHLORIDE ER 5 MG PO TB24
15.0000 mg | ORAL_TABLET | Freq: Every day | ORAL | Status: DC
  Administered 2018-11-05 – 2018-11-07 (×3): 15 mg via ORAL
  Filled 2018-11-05 (×4): qty 1

## 2018-11-05 MED ORDER — VANCOMYCIN HCL IN DEXTROSE 1-5 GM/200ML-% IV SOLN
1000.0000 mg | Freq: Once | INTRAVENOUS | Status: AC
  Administered 2018-11-05: 1000 mg via INTRAVENOUS
  Filled 2018-11-05: qty 200

## 2018-11-05 MED ORDER — ALBUTEROL SULFATE (2.5 MG/3ML) 0.083% IN NEBU: 2.5000 mg | INHALATION_SOLUTION | RESPIRATORY_TRACT | Status: DC | PRN

## 2018-11-05 MED ORDER — SODIUM CHLORIDE 0.9 % IV SOLN
3.0000 g | Freq: Four times a day (QID) | INTRAVENOUS | Status: DC
  Administered 2018-11-05 – 2018-11-08 (×11): 3 g via INTRAVENOUS
  Filled 2018-11-05 (×15): qty 3

## 2018-11-05 MED ORDER — POLYETHYLENE GLYCOL 3350 17 G PO PACK: 17.0000 g | PACK | Freq: Every day | ORAL | Status: DC | PRN

## 2018-11-05 MED ORDER — TRAMADOL HCL 50 MG PO TABS
50.0000 mg | ORAL_TABLET | Freq: Four times a day (QID) | ORAL | Status: DC | PRN
  Administered 2018-11-05 – 2018-11-07 (×2): 50 mg via ORAL
  Filled 2018-11-05 (×2): qty 1

## 2018-11-05 MED ORDER — BUTALBITAL-APAP-CAFFEINE 50-325-40 MG PO TABS
1.0000 | ORAL_TABLET | ORAL | Status: DC | PRN
  Administered 2018-11-06 – 2018-11-07 (×2): 1 via ORAL
  Filled 2018-11-05 (×4): qty 1

## 2018-11-05 MED ORDER — METHYLPREDNISOLONE SODIUM SUCC 125 MG IJ SOLR
60.0000 mg | INTRAMUSCULAR | Status: DC
  Administered 2018-11-05 – 2018-11-06 (×2): 60 mg via INTRAVENOUS
  Filled 2018-11-05 (×2): qty 2

## 2018-11-05 MED ORDER — SODIUM CHLORIDE 0.9 % IV BOLUS
1000.0000 mL | Freq: Once | INTRAVENOUS | Status: AC
  Administered 2018-11-05: 1000 mL via INTRAVENOUS

## 2018-11-05 MED ORDER — ALBUTEROL SULFATE (2.5 MG/3ML) 0.083% IN NEBU: 5.0000 mg | INHALATION_SOLUTION | Freq: Once | RESPIRATORY_TRACT | Status: DC

## 2018-11-05 MED ORDER — SODIUM CHLORIDE 0.9 % IV SOLN
INTRAVENOUS | Status: DC | PRN
  Administered 2018-11-05: 20:00:00 250 mL via INTRAVENOUS
  Administered 2018-11-06: 10 mL via INTRAVENOUS
  Administered 2018-11-07: 5 mL via INTRAVENOUS
  Administered 2018-11-07 (×2): 250 mL via INTRAVENOUS

## 2018-11-05 MED ORDER — ACETAMINOPHEN 325 MG PO TABS: 650.0000 mg | ORAL_TABLET | Freq: Four times a day (QID) | ORAL | Status: DC | PRN

## 2018-11-05 MED ORDER — ONDANSETRON HCL 4 MG PO TABS: 4.0000 mg | ORAL_TABLET | Freq: Four times a day (QID) | ORAL | Status: DC | PRN

## 2018-11-05 NOTE — ED Notes (Signed)
Unable to get 2nd set of blood cultures. IV antibiotics started.

## 2018-11-05 NOTE — ED Notes (Signed)
ED Provider at bedside. 

## 2018-11-05 NOTE — ED Notes (Signed)
Date and time results received: 11/05/18 1545   Test: lactic acid Critical Value: 2.0  Name of Provider Notified: Dr Marisa Severin

## 2018-11-05 NOTE — ED Notes (Signed)
Attempted to call report to 1C. Placed on hold for 5 minutes. Will attempt to call back.

## 2018-11-05 NOTE — ED Notes (Addendum)
ED TO INPATIENT HANDOFF REPORT  ED Nurse Name and Phone #: Elijah Birk RN   [phone]  S Name/Age/Gender Mathew Porter 63 y.o. male Room/Bed: ED12A/ED12A  Code Status   Code Status: Full Code  Home/SNF/Other Home Patient oriented to: self, place, time and situation Is this baseline? Yes   Triage Complete: Triage complete  Chief Complaint sob  Triage Note Says he has chills, fever, n/v/d,aches, short of breath and headaches.  Since sun am.  Also has swollen left lower extremity and red, so clinic told him to come here due to the sob and the leg swelling because they were worried he had a clot.  He is slightly short of breath after walking in.  Able to speak in sentences.     Allergies No Known Allergies  Level of Care/Admitting Diagnosis ED Disposition    ED Disposition Condition Comment   Admit  Hospital Area: [facility] [[id number]]  Level of Care: Med-Surg [16]  Diagnosis: Left leg cellulitis [413244]  Admitting Physician: Milagros Loll [[id number]]  Attending Physician: Milagros Loll [[id number]]  Estimated length of stay: past midnight tomorrow  Certification:: I certify this patient will need inpatient services for at least 2 midnights  PT Class (Do Not Modify): Inpatient [101]  PT Acc Code (Do Not Modify): Private [1]       B Medical/Surgery History Past Medical History:  Diagnosis Date  . Anxiety   . HTN (hypertension)   . Obesity   . Sleep apnea    Past Surgical History:  Procedure Laterality Date  . COLON SURGERY  1994   Diverticulitis  . HYDROCELE EXCISION / REPAIR  2009  . VASECTOMY  2009     A IV Location/Drains/Wounds Patient Lines/Drains/Airways Status   Active Line/Drains/Airways    Name:   Placement date:   Placement time:   Site:   Days:   Peripheral IV 11/05/18 Right Antecubital   11/05/18    1146    Antecubital   less than 1   Peripheral IV 11/05/18 Left Antecubital   11/05/18    1501    Antecubital   less than 1           Intake/Output Last 24 hours  Intake/Output Summary (Last 24 hours) at 11/05/2018 1648 Last data filed at 11/05/2018 1628 Gross per 24 hour  Intake 1292.94 ml  Output -  Net 1292.94 ml    Labs/Imaging Results for orders placed or performed during the hospital encounter of 11/05/18 (from the past 48 hour(s))  Basic metabolic panel     Status: Abnormal   Collection Time: 11/05/18 11:37 AM  Result Value Ref Range   Sodium 135 135 - 145 mmol/L   Potassium 3.7 3.5 - 5.1 mmol/L   Chloride 100 98 - 111 mmol/L   CO2 22 22 - 32 mmol/L   Glucose, Bld 184 (H) 70 - 99 mg/dL   BUN 15 8 - 23 mg/dL   Creatinine, Ser 0.34 0.61 - 1.24 mg/dL   Calcium 8.9 8.9 - 74.2 mg/dL   GFR calc non Af Amer >60 >60 mL/min   GFR calc Af Amer >60 >60 mL/min   Anion gap 13 5 - 15    Comment: Performed at [facility], [address] [place], [place] [address]  CBC with Differential     Status: Abnormal   Collection Time: 11/05/18 11:37 AM  Result Value Ref Range   WBC 18.8 (H) 4.0 - 10.5 K/uL   RBC 5.10 4.22 -  5.81 MIL/uL   Hemoglobin 14.9 13.0 - 17.0 g/dL   HCT 16.1 09.6 - 04.5 %   MCV 88.0 80.0 - 100.0 fL   MCH 29.2 26.0 - 34.0 pg   MCHC 33.2 30.0 - 36.0 g/dL   RDW 40.9 81.1 - 91.4 %   Platelets 232 150 - 400 K/uL   nRBC 0.0 0.0 - 0.2 %   Neutrophils Relative % 90 %   Neutro Abs 16.9 (H) 1.7 - 7.7 K/uL   Lymphocytes Relative 6 %   Lymphs Abs 1.1 0.7 - 4.0 K/uL   Monocytes Relative 3 %   Monocytes Absolute 0.6 0.1 - 1.0 K/uL   Eosinophils Relative 0 %   Eosinophils Absolute 0.0 0.0 - 0.5 K/uL   Basophils Relative 0 %   Basophils Absolute 0.0 0.0 - 0.1 K/uL   Immature Granulocytes 1 %   Abs Immature Granulocytes 0.17 (H) 0.00 - 0.07 K/uL    Comment: Performed at [facility], [address] [place], [place] [address]  Troponin I - Once     Status: None   Collection Time: 11/05/18 11:37 AM  Result Value Ref Range   Troponin I <0.03 <0.03 ng/mL    Comment: Performed at  [facility], [address] [place], [place] [address]  Fibrin derivatives D-Dimer     Status: Abnormal   Collection Time: 11/05/18 11:37 AM  Result Value Ref Range   Fibrin derivatives D-dimer ([facility]) 832.83 (H) 0.00 - 499.00 ng/mL (FEU)    Comment: (NOTE) <> Exclusion of Venous Thromboembolism (VTE) - OUTPATIENT ONLY   (Emergency Department or [place])   0-499 ng/ml (FEU): With a low to intermediate pretest probability                      for VTE this test result excludes the diagnosis                      of VTE.   >499 ng/ml (FEU) : VTE not excluded; additional work up for VTE is                      required. <> Testing on Inpatients and Evaluation of Disseminated Intravascular   Coagulation (DIC) Reference Range:   0-499 ng/ml (FEU) Performed at [facility], [address] [place], [place] [address]   Protime-INR     Status: None   Collection Time: 11/05/18 11:37 AM  Result Value Ref Range   Prothrombin Time 13.6 11.4 - 15.2 seconds   INR 1.1 0.8 - 1.2    Comment: (NOTE) INR goal varies based on device and disease states. Performed at [facility], [address] [place], [place] [address]   Influenza panel by PCR (type A & B)     Status: None   Collection Time: 11/05/18 11:45 AM  Result Value Ref Range   Influenza A By PCR NEGATIVE NEGATIVE   Influenza B By PCR NEGATIVE NEGATIVE    Comment: (NOTE) The Xpert Xpress Flu assay is intended as an aid in the diagnosis of  influenza and should not be used as a sole basis for treatment.  This  assay is FDA approved for nasopharyngeal swab specimens only. Nasal  washings and aspirates are unacceptable for Xpert Xpress Flu testing. Performed at [facility], [address] [place], [place] [address]   Lactic acid, plasma     Status: Abnormal   Collection  Time: 11/05/18  3:03 PM  Result Value Ref Range   Lactic Acid, Venous 2.0 (HH) 0.5 - 1.9 mmol/L    Comment: CRITICAL RESULT  CALLED TO, READ BACK BY AND VERIFIED WITH TOM NEGY 11/05/18 1539 KLW Performed at [facility], [address] [place] [place], [place] [address]    Dg Chest 2 View  Result Date: 11/05/2018 CLINICAL DATA:  Shortness of breath and tachycardia EXAM: CHEST - 2 VIEW COMPARISON:  November 09, 2011 FINDINGS: There is no edema or consolidation. Heart size and pulmonary vascularity are normal. No adenopathy. No bone lesions. IMPRESSION: No edema or consolidation. Electronically Signed   By: Bretta Bang III M.D.   On: 11/05/2018 12:08   US Venous Img Lower Unilateral Left  Result Date: 11/05/2018 CLINICAL DATA:  Left leg swelling for 2 days EXAM: LEFT LOWER EXTREMITY VENOUS DOPPLER ULTRASOUND TECHNIQUE: Gray-scale sonography with graded compression, as well as color Doppler and duplex ultrasound were performed to evaluate the lower extremity deep venous systems from the level of the common femoral vein and including the common femoral, femoral, profunda femoral, popliteal and calf veins including the posterior tibial, peroneal and gastrocnemius veins when visible. The superficial great saphenous vein was also interrogated. Spectral Doppler was utilized to evaluate flow at rest and with distal augmentation maneuvers in the common femoral, femoral and popliteal veins. COMPARISON:  11/01/2009 FINDINGS: Contralateral Common Femoral Vein: Respiratory phasicity is normal and symmetric with the symptomatic side. No evidence of thrombus. Normal compressibility. Common Femoral Vein: No evidence of thrombus. Normal compressibility, respiratory phasicity and response to augmentation. Saphenofemoral Junction: No evidence of thrombus. Normal compressibility and flow on color Doppler imaging. Profunda Femoral Vein: No evidence of thrombus. Normal compressibility and flow on color Doppler imaging. Femoral Vein: No evidence of thrombus. Normal compressibility, respiratory phasicity and response to augmentation. Popliteal  Vein: No evidence of thrombus. Normal compressibility, respiratory phasicity and response to augmentation. Calf Veins: No evidence of thrombus. Normal compressibility and flow on color Doppler imaging. Superficial Great Saphenous Vein: No evidence of thrombus. Normal compressibility. Venous Reflux:  None. Other Findings:  None. IMPRESSION: No evidence of deep venous thrombosis. Electronically Signed   By: Alcide Clever M.D.   On: 11/05/2018 14:19    Pending Labs Unresulted Labs (From admission, onward)    Start     Ordered   11/12/18 0500  Creatinine, serum  (enoxaparin (LOVENOX)    CrCl >/= 30 ml/min)  Weekly,   STAT    Comments:  while on enoxaparin therapy    11/05/18 1607   11/06/18 0500  Basic metabolic panel  Tomorrow morning,   STAT     11/05/18 1607   11/06/18 0500  CBC  Tomorrow morning,   STAT     11/05/18 1607   11/05/18 1607  HIV antibody (Routine Testing)  Add-on,   AD     11/05/18 1607   11/05/18 1421  Culture, blood (routine x 2)  BLOOD CULTURE X 2,   STAT     11/05/18 1420   11/05/18 1421  Lactic acid, plasma  Now then every 2 hours,   STAT     11/05/18 1420          Vitals/Pain Today's Vitals   11/05/18 1124 11/05/18 1125 11/05/18 1141 11/05/18 1436  BP:    (!) 132/96  Pulse:   (!) 124 (!) 114  Resp:   (!) 24 (!) 21  Temp:    98.7 F (37.1 C)  TempSrc:  Oral  SpO2:   97% 97%  Weight:  (!) 142.9 kg    Height:  5\' 11"  (1.803 m)    PainSc: 5        Isolation Precautions Droplet precaution  Medications Medications  sodium chloride 0.9 % bolus 1,000 mL (has no administration in time range)  enoxaparin (LOVENOX) injection 40 mg (has no administration in time range)  acetaminophen (TYLENOL) tablet 650 mg (has no administration in time range)    Or  acetaminophen (TYLENOL) suppository 650 mg (has no administration in time range)  traMADol (ULTRAM) tablet 50 mg (has no administration in time range)  polyethylene glycol (MIRALAX / GLYCOLAX) packet 17 g (has  no administration in time range)  ondansetron (ZOFRAN) tablet 4 mg (has no administration in time range)    Or  ondansetron (ZOFRAN) injection 4 mg (has no administration in time range)  albuterol (PROVENTIL) (2.5 MG/3ML) 0.083% nebulizer solution 2.5 mg (has no administration in time range)  pantoprazole (PROTONIX) EC tablet 40 mg (has no administration in time range)  calcium carbonate (TUMS - dosed in mg elemental calcium) chewable tablet 200 mg of elemental calcium (has no administration in time range)  methylPREDNISolone sodium succinate (SOLU-MEDROL) 125 mg/2 mL injection 60 mg (has no administration in time range)  sodium chloride 0.9 % bolus 1,000 mL (0 mLs Intravenous Stopped 11/05/18 1433)  vancomycin (VANCOCIN) IVPB 1000 mg/200 mL premix (0 mg Intravenous Stopped 11/05/18 1628)  cefTRIAXone (ROCEPHIN) 2 g in sodium chloride 0.9 % 100 mL IVPB (0 g Intravenous Stopped 11/05/18 1555)    Mobility walks Low fall risk   Focused Assessments Cardiac Assessment Handoff:    Lab Results  Component Value Date   TROPONINI <0.03 11/05/2018   No results found for: DDIMER Does the Patient currently have chest pain? No  , Pulmonary Assessment Handoff:  Lung sounds:   O2 Device: Room Air        R Recommendations: See Admitting Provider Note  Report given to:  Corrie Dandy RN  Additional Notes:

## 2018-11-05 NOTE — Progress Notes (Signed)
Advance care planning  Purpose of Encounter Cellulitis and sepsis  Parties in attendance Patient  Patients Decisional capacity Patient is alert and oriented.  Able to make medical decisions.  No documented healthcare power of attorney or ACP documents.  Patient wants wife to be the healthcare power of attorney.  Discussed regarding cellulitis, sepsis, being acutely ill and needing hospitalization.  Prognosis discussed.  Treatment plan discussed.  All questions answered.  CODE STATUS discussed and patient wishes to have aggressive care with intubation and CPR if need arises.  Orders entered and CODE STATUS changed.  Full code  Time spent - 17 minutes

## 2018-11-05 NOTE — ED Notes (Signed)
2nd attempt to call report. Was told that nurse was just assigned and needed 5 minutes to review chart. Will call back in 5 min.

## 2018-11-05 NOTE — ED Triage Notes (Signed)
Says he has chills, fever, n/v/d,aches, short of breath and headaches.  Since sun am.  Also has swollen left lower extremity and red, so clinic told him to come here due to the sob and the leg swelling because they were worried he had a clot.  He is slightly short of breath after walking in.  Able to speak in sentences.

## 2018-11-05 NOTE — Consult Note (Signed)
Pharmacy Antibiotic Note  Mathew Porter is a 63 y.o. male admitted on 11/05/2018 with sepsis and cellulitis.  Pharmacy has been consulted for vancomycin and Unasyn dosing.  Plan: 1)Vancomycin 1250 mg IV Q 12 hrs after initial loading dose of 1g IV in the ED. Goal AUC 400-550. Expected AUC: 506.4 Css: 15.0 SCr used: 1.06  2) Unasyn 3 g IV every 6 hours  Height: 5\' 11"  (180.3 cm) Weight: (!) 315 lb (142.9 kg) IBW/kg (Calculated) : 75.3  Temp (24hrs), Avg:99 F (37.2 C), Min:98.3 F (36.8 C), Max:99.9 F (37.7 C)  Recent Labs  Lab 11/05/18 1137 11/05/18 1503  WBC 18.8*  --   CREATININE 1.06  --   LATICACIDVEN  --  2.0*    Estimated Creatinine Clearance: 105.9 mL/min (by C-G formula based on SCr of 1.06 mg/dL).    No Known Allergies  Antimicrobials this admission: Vancomycin 3/17 >>  Unasyn 3/17 >>  Ceftriaxone 3/17 x 1  Dose adjustments this admission:  Microbiology results: 3/17 BCx: pending 3/17 influenza: negative   Thank you for allowing pharmacy to be a part of this patient's care.  Angel Weedon A Efren Kross 11/05/2018 6:01 PM

## 2018-11-05 NOTE — ED Notes (Signed)
Responded to call bell. Pt able to ambulate to commode to urinate. Pt returned to bed, all leads reconnected. Pt resting comfortably.

## 2018-11-05 NOTE — Progress Notes (Signed)
PHARMACIST - PHYSICIAN ORDER COMMUNICATION  CONCERNING: Verapamil and Simvastatin  and risk of rhabdomyolysis  DESCRIPTION:  Patients on verapamil and simvastatin >10 mg/day have reported cases of rhabdomyolysis. Pharmacy is to assess simvastatin dose. If >10 mg, substitute atorvastatin (Lipitor) 1mg  for each 2mg  simvastatin.  This patient is ordered simvastatin 20mg  and Verapamil 240mg  CR.    ACTION TAKEN: Per protocol pharmacy has discontinued the patient's order for simvastatin and replaced it with Atorvastatin 10mg .   Gardner Candle, PharmD, BCPS Clinical Pharmacist 11/05/2018 10:38 PM

## 2018-11-05 NOTE — ED Notes (Signed)
Pt reports SoB beginning last night. Secondary complaint of pain to L lower leg. Extremity warm to touch with significant erythema around full circumference of calf and foot.

## 2018-11-05 NOTE — H&P (Addendum)
[facility] - [place] at [facility]   PATIENT NAME: Mathew Porter    MR#:  [id number]  DATE OF BIRTH:  June 15, 1956  DATE OF ADMISSION:  11/05/2018  PRIMARY CARE PHYSICIAN: Jerl Mina, MD   REQUESTING/REFERRING PHYSICIAN: Dr. Marisa Severin  CHIEF COMPLAINT:   Chief Complaint  Patient presents with  . Shortness of Breath  . Leg Swelling    HISTORY OF PRESENT ILLNESS:  Mathew Porter  is a 63 y.o. male with a known history of hypertension, obesity, sleep apnea, anxiety presents to the emergency room complaining of 3 days of not feeling well.  Patient initially have chills, sinus congestion, headache.  Then started developing fever and chills.  He noticed significant swelling and redness of his left lower extremity.  Today he has extensive redness all over his foot extending up the leg up to the knee.  Found to have lactic acid of 2, tachycardia.  He also complains of burning in his chest. Patient has no recent travel history. Works as a Magazine features editor at [employer].  PAST MEDICAL HISTORY:   Past Medical History:  Diagnosis Date  . Anxiety   . HTN (hypertension)   . Obesity   . Sleep apnea     PAST SURGICAL HISTORY:   Past Surgical History:  Procedure Laterality Date  . COLON SURGERY  1994   Diverticulitis  . HYDROCELE EXCISION / REPAIR  2009  . VASECTOMY  2009    SOCIAL HISTORY:   Social History   Tobacco Use  . Smoking status: Former Smoker    Years: 40.00    Types: Cigarettes    Last attempt to quit: 08/21/2014    Years since quitting: 4.2  . Smokeless tobacco: Never Used  Substance Use Topics  . Alcohol use: Not Currently    FAMILY HISTORY:   Family History  Problem Relation Age of Onset  . Lung cancer Father     DRUG ALLERGIES:  No Known Allergies  REVIEW OF SYSTEMS:   Review of Systems  Constitutional: Positive for malaise/fatigue. Negative for chills and fever.  HENT: Negative for sore throat.   Eyes: Negative for blurred vision,  double vision and pain.  Respiratory: Positive for cough and shortness of breath. Negative for hemoptysis and wheezing.   Cardiovascular: Positive for leg swelling. Negative for chest pain, palpitations and orthopnea.  Gastrointestinal: Negative for abdominal pain, constipation, diarrhea, heartburn, nausea and vomiting.  Genitourinary: Negative for dysuria and hematuria.  Musculoskeletal: Negative for back pain and joint pain.  Skin: Negative for rash.  Neurological: Negative for sensory change, speech change, focal weakness and headaches.  Endo/Heme/Allergies: Does not bruise/bleed easily.  Psychiatric/Behavioral: Negative for depression. The patient is not nervous/anxious.     MEDICATIONS AT HOME:   Prior to Admission medications   Medication Sig Start Date End Date Taking? Authorizing Provider  aspirin-acetaminophen-caffeine (EXCEDRIN MIGRAINE) [phone] MG tablet Take by mouth.    [provider]  levofloxacin (LEVAQUIN) 250 MG tablet  05/08/18   [provider]  oxybutynin (DITROPAN XL) 15 MG 24 hr tablet Take 1 tablet (15 mg total) by mouth at bedtime. 08/27/18   McGowan, Carollee Herter A, PA-C  simvastatin (ZOCOR) 20 MG tablet TAKE 1 TABLET BY MOUTH  NIGHTLY 10/15/17   [provider]  tamsulosin (FLOMAX) 0.4 MG CAPS capsule Take by mouth. 04/26/18   [provider]  verapamil (CALAN-SR) 240 MG CR tablet TAKE 1 TABLET BY MOUTH ONCE DAILY 10/15/17   [provider]  VITAL SIGNS:  Blood pressure (!) 132/96, pulse (!) 114, temperature 98.7 F (37.1 C), temperature source Oral, resp. rate (!) 21, height 5\' 11"  (1.803 m), weight (!) 142.9 kg, SpO2 97 %.  PHYSICAL EXAMINATION:  Physical Exam  GENERAL:  63 y.o.-year-old patient lying in the bed with no acute distress.  EYES: Pupils equal, round, reactive to light and accommodation. No scleral icterus. Extraocular muscles intact.  HEENT: Head atraumatic, normocephalic. Oropharynx and nasopharynx  clear. No oropharyngeal erythema, moist oral mucosa  NECK:  Supple, no jugular venous distention. No thyroid enlargement, no tenderness.  LUNGS: Bilateral wheezing CARDIOVASCULAR: S1, S2 tachycardic. No murmurs, rubs, or gallops.  ABDOMEN: Soft, nontender, nondistended. Bowel sounds present. No organomegaly or mass.  EXTREMITIES: No pedal edema, cyanosis, or clubbing. + 2 pedal & radial pulses b/l.   NEUROLOGIC: Cranial nerves II through XII are intact. No focal Motor or sensory deficits appreciated b/l PSYCHIATRIC: The patient is alert and oriented x 3. Good affect.  SKIN: Left lower extremity redness is circumferential all over the leg and foot.  Swelling.  No crepitus.  LABORATORY PANEL:   CBC Recent Labs  Lab 11/05/18 1137  WBC 18.8*  HGB 14.9  HCT 44.9  PLT 232   ------------------------------------------------------------------------------------------------------------------  Chemistries  Recent Labs  Lab 11/05/18 1137  NA 135  K 3.7  CL 100  CO2 22  GLUCOSE 184*  BUN 15  CREATININE 1.06  CALCIUM 8.9   ------------------------------------------------------------------------------------------------------------------  Cardiac Enzymes Recent Labs  Lab 11/05/18 1137  TROPONINI <0.03   ------------------------------------------------------------------------------------------------------------------  RADIOLOGY:  Dg Chest 2 View  Result Date: 11/05/2018 CLINICAL DATA:  Shortness of breath and tachycardia EXAM: CHEST - 2 VIEW COMPARISON:  November 09, 2011 FINDINGS: There is no edema or consolidation. Heart size and pulmonary vascularity are normal. No adenopathy. No bone lesions. IMPRESSION: No edema or consolidation. Electronically Signed   By: Bretta Bang III M.D.   On: 11/05/2018 12:08   US Venous Img Lower Unilateral Left  Result Date: 11/05/2018 CLINICAL DATA:  Left leg swelling for 2 days EXAM: LEFT LOWER EXTREMITY VENOUS DOPPLER ULTRASOUND TECHNIQUE:  Gray-scale sonography with graded compression, as well as color Doppler and duplex ultrasound were performed to evaluate the lower extremity deep venous systems from the level of the common femoral vein and including the common femoral, femoral, profunda femoral, popliteal and calf veins including the posterior tibial, peroneal and gastrocnemius veins when visible. The superficial great saphenous vein was also interrogated. Spectral Doppler was utilized to evaluate flow at rest and with distal augmentation maneuvers in the common femoral, femoral and popliteal veins. COMPARISON:  11/01/2009 FINDINGS: Contralateral Common Femoral Vein: Respiratory phasicity is normal and symmetric with the symptomatic side. No evidence of thrombus. Normal compressibility. Common Femoral Vein: No evidence of thrombus. Normal compressibility, respiratory phasicity and response to augmentation. Saphenofemoral Junction: No evidence of thrombus. Normal compressibility and flow on color Doppler imaging. Profunda Femoral Vein: No evidence of thrombus. Normal compressibility and flow on color Doppler imaging. Femoral Vein: No evidence of thrombus. Normal compressibility, respiratory phasicity and response to augmentation. Popliteal Vein: No evidence of thrombus. Normal compressibility, respiratory phasicity and response to augmentation. Calf Veins: No evidence of thrombus. Normal compressibility and flow on color Doppler imaging. Superficial Great Saphenous Vein: No evidence of thrombus. Normal compressibility. Venous Reflux:  None. Other Findings:  None. IMPRESSION: No evidence of deep venous thrombosis. Electronically Signed   By: Alcide Clever M.D.   On: 11/05/2018 14:19   IMPRESSION AND PLAN:   *  Acute left lower extremity cellulitis and sepsis with extensive redness all over the foot and leg extending up to the knee Will start vancomycin and Unasyn.  Blood cultures.  Lactic acid elevated and have ordered a bolus.  Repeat lactic acid  in 3 hours. Ultrasound shows no DVT or abscess  * Acute bronchitis with Bilateral wheezing.  Influenza checked and negative.  Will start steroids.  Nebulizers as needed.  Nothing acute on chest x-ray.  *Hypertension.  Continue home medications per.  *DVT prophylaxis with Lovenox  All the records are reviewed and case discussed with ED provider. Management plans discussed with the patient, family and they are in agreement.  CODE STATUS: Full code  TOTAL TIME TAKING CARE OF THIS PATIENT: 35 minutes.   Orie Fisherman M.D on 11/05/2018 at 4:11 PM  Between 7am to 6pm - Pager - [phone]  After 6pm go to [email or web address] - password [email or web address]  [facility]  Office  [phone]  CC: Primary care physician; Jerl Mina, MD  Note: This dictation was prepared with Dragon dictation along with smaller phrase technology. Any transcriptional errors that result from this process are unintentional.

## 2018-11-05 NOTE — ED Provider Notes (Signed)
[facility] Emergency Department Provider Note ____________________________________________   First MD Initiated Contact with Patient 11/05/18 1135     (approximate)  I have reviewed the triage vital signs and the nursing notes.   HISTORY  Chief Complaint Shortness of Breath and Leg Swelling    HPI Mathew Porter is a 63 y.o. male with PMH as noted below who presents with multiple symptoms over approximately last 2 days.  He reports shortness of breath, gradual onset, worse with exertion, not associated with chest pain or cough.  He also reports body aches, vomiting and diarrhea, as well as subjective fever and chills.  In addition, he developed swelling and pain to the left lower extremity over the last 2 days.  He denies any injuries to the leg.  No prior history of similar swelling, no prior history of DVT or PE.  Past Medical History:  Diagnosis Date  . Anxiety   . Sleep apnea     Patient Active Problem List   Diagnosis Date Noted  . Anxiety 05/23/2018  . Chickenpox 05/23/2018  . Colon polyp 05/23/2018  . Diverticulosis 05/23/2018  . Headache 05/23/2018  . Hypertension 05/23/2018  . OAB (overactive bladder) 05/23/2018  . Morbid obesity with BMI of 40.0-44.9, adult (HCC) 12/21/2015    Past Surgical History:  Procedure Laterality Date  . COLON SURGERY  1994   Diverticulitis  . HYDROCELE EXCISION / REPAIR  2009  . VASECTOMY  2009    Prior to Admission medications   Medication Sig Start Date End Date Taking? Authorizing Provider  aspirin-acetaminophen-caffeine (EXCEDRIN MIGRAINE) [phone] MG tablet Take by mouth.    [provider]  levofloxacin (LEVAQUIN) 250 MG tablet  05/08/18   [provider]  oxybutynin (DITROPAN XL) 15 MG 24 hr tablet Take 1 tablet (15 mg total) by mouth at bedtime. 08/27/18   McGowan, Carollee Herter A, PA-C  simvastatin (ZOCOR) 20 MG tablet TAKE 1 TABLET BY MOUTH  NIGHTLY 10/15/17   [provider]  tamsulosin (FLOMAX) 0.4 MG CAPS capsule Take by mouth. 04/26/18   [provider]  verapamil (CALAN-SR) 240 MG CR tablet TAKE 1 TABLET BY MOUTH ONCE DAILY 10/15/17   [provider]    Allergies Patient has no known allergies.  Family History  Problem Relation Age of Onset  . Lung cancer Father     Social History Social History   Tobacco Use  . Smoking status: Former Smoker    Years: 40.00    Types: Cigarettes    Last attempt to quit: 08/21/2014    Years since quitting: 4.2  . Smokeless tobacco: Never Used  Substance Use Topics  . Alcohol use: Not Currently  . Drug use: Never    Review of Systems  Constitutional: Positive for fever and chills. Eyes: No redness. ENT: No sore throat. Cardiovascular: Denies chest pain. Respiratory: Positive for shortness of breath. Gastrointestinal: Positive for vomiting and diarrhea.  Genitourinary: Negative for dysuria.  Musculoskeletal: Negative for back pain. Skin: Positive for rash. Neurological: Positive for headache.   ____________________________________________   PHYSICAL EXAM:  VITAL SIGNS: ED Triage Vitals  Enc Vitals Group     BP --      Pulse Rate 11/05/18 1123 (!) 132     Resp 11/05/18 1123 18     Temp 11/05/18 1123 98.3 F (36.8 C)     Temp Source 11/05/18 1123 Oral     SpO2 11/05/18 1123 94 %     Weight 11/05/18  1125 (!) 315 lb (142.9 kg)     Height 11/05/18 1125  (1.803 m)     Head Circumference --      Peak Flow --      Pain Score 11/05/18 1124 5     Pain Loc --      Pain Edu? --      Excl. in GC? --     Constitutional: Alert and oriented.  Uncomfortable appearing but in no acute distress. Eyes: Conjunctivae are normal.  Head: Atraumatic. Nose: No congestion/rhinnorhea. Mouth/Throat: Mucous membranes are slightly dry.   Neck: Normal range of motion.  Cardiovascular: Tachycardic, regular rhythm. Grossly normal heart sounds.  Good peripheral circulation. Respiratory: Normal  respiratory effort.  No retractions. Lungs CTAB. Gastrointestinal: No distention.  Musculoskeletal: Extremities warm and well perfused.  Left lower extremity with approximately 2+ edema below the knee with extensive erythema, induration, and warmth anteriorly from the ankle to just below the knee.  No streaking or rash above the knee.  Full range of motion at knee and ankle. Neurologic:  Normal speech and language. No gross focal neurologic deficits are appreciated.  Skin:  Skin is warm and dry.  Left lower extremity rash as above. Psychiatric: Mood and affect are normal. Speech and behavior are normal.  ____________________________________________   LABS (all labs ordered are listed, but only abnormal results are displayed)  Labs Reviewed  BASIC METABOLIC PANEL - Abnormal; Notable for the following components:      Result Value   Glucose, Bld 184 (*)    All other components within normal limits  CBC WITH DIFFERENTIAL/PLATELET - Abnormal; Notable for the following components:   WBC 18.8 (*)    Neutro Abs 16.9 (*)    Abs Immature Granulocytes 0.17 (*)    All other components within normal limits  FIBRIN DERIVATIVES D-DIMER ([facility] ONLY) - Abnormal; Notable for the following components:   Fibrin derivatives D-dimer ([facility]) 832.83 (*)    All other components within normal limits  LACTIC ACID, PLASMA - Abnormal; Notable for the following components:   Lactic Acid, Venous 2.0 (*)    All other components within normal limits  CULTURE, BLOOD (ROUTINE X 2)  CULTURE, BLOOD (ROUTINE X 2)  TROPONIN I  PROTIME-INR  INFLUENZA PANEL BY PCR (TYPE A & B)  LACTIC ACID, PLASMA   ____________________________________________  EKG  ED ECG REPORT I, Dionne Bucy, the attending physician, personally viewed and interpreted this ECG.  Date: 11/05/2018 EKG Time: 1128 Rate: 128 Rhythm: Sinus tachycardia QRS Axis: normal Intervals: normal ST/T Wave abnormalities: normal Narrative  Interpretation: no evidence of acute ischemia  ____________________________________________  RADIOLOGY  CXR: No focal infiltrate [place] LLE: No acute DVT  ____________________________________________   PROCEDURES  Procedure(s) performed: No  Procedures  Critical Care performed: No ____________________________________________   INITIAL IMPRESSION / ASSESSMENT AND PLAN / ED COURSE  Pertinent labs & imaging results that were available during my care of the patient were reviewed by me and considered in my medical decision making (see chart for details).  63 year old male with PMH as noted above presents with multiple symptoms over the last 2 days, with shortness of breath, subjective fever and chills, vomiting and diarrhea, as well as left lower extremity swelling and rash.  On exam the patient is relatively well-appearing overall.  He is tachycardic with otherwise normal vital signs.  His lungs are clear and he has no respiratory distress.  The left lower extremity shows significant soft tissue swelling as well as erythema,  induration, and warmth to most of the anterior lower leg.  The overall presentation is most consistent with influenza or other viral syndrome although this would not explain the left lower extremity rash.  The lower extremity appears more consistent with cellulitis, so it is also possible that the patient is having some systemic symptoms related to this infection especially given the relatively large area affected.  Differential also includes DVT/PE.  We will obtain chest x-ray and ultrasound of left lower extremity, lab work-up, give fluids, and reassess.  ----------------------------------------- 3:41 PM on 11/05/2018 -----------------------------------------  Ultrasound shows no evidence of DVT.  Chest x-ray and influenza are negative.  The patient has an elevated WBC count and lactic acid.  Overall the presentation is most consistent with cellulitis, with the  patient's tachycardia, myalgias, and fever being systemic symptoms from this.  I ordered empiric antibiotics for cellulitis.  I signed the patient out to the hospitalist Dr. Elpidio Anis. ____________________________________________   FINAL CLINICAL IMPRESSION(S) / ED DIAGNOSES  Final diagnoses:  Cellulitis of left lower extremity  Sepsis without acute organ dysfunction, due to unspecified organism [facility])      NEW MEDICATIONS STARTED DURING THIS VISIT:  New Prescriptions   No medications on file     Note:  This document was prepared using Dragon voice recognition software and may include unintentional dictation errors.    Dionne Bucy, MD 11/05/18 [phone]

## 2018-11-05 NOTE — ED Notes (Signed)
Unsuccessful after 2 attempts to draw blood cultures/ lactic acid. Jeannett Senior RN contacted. Will try to collect samples.

## 2018-11-06 LAB — BASIC METABOLIC PANEL
Anion gap: 12 (ref 5–15)
BUN: 13 mg/dL (ref 8–23)
CO2: 21 mmol/L — AB (ref 22–32)
Calcium: 8 mg/dL — ABNORMAL LOW (ref 8.9–10.3)
Chloride: 101 mmol/L (ref 98–111)
Creatinine, Ser: 0.91 mg/dL (ref 0.61–1.24)
GFR calc Af Amer: >60 mL/min (ref >60)
GFR calc non Af Amer: >60 mL/min (ref >60)
Glucose, Bld: 226 mg/dL — ABNORMAL HIGH (ref 70–99)
Potassium: 3.4 mmol/L — ABNORMAL LOW (ref 3.5–5.1)
Sodium: 134 mmol/L — ABNORMAL LOW (ref 135–145)

## 2018-11-06 LAB — CBC
HCT: 39.8 % (ref 39.0–52.0)
Hemoglobin: 13.1 g/dL (ref 13.0–17.0)
MCH: 28.9 pg (ref 26.0–34.0)
MCHC: 32.9 g/dL (ref 30.0–36.0)
MCV: 87.7 fL (ref 80.0–100.0)
Platelets: 212 10*3/uL (ref 150–400)
RBC: 4.54 MIL/uL (ref 4.22–5.81)
RDW: 14.9 % (ref 11.5–15.5)
WBC: 19 10*3/uL — ABNORMAL HIGH (ref 4.0–10.5)
nRBC: 0 % (ref 0.0–0.2)

## 2018-11-06 MED ORDER — POTASSIUM CHLORIDE CRYS ER 20 MEQ PO TBCR
40.0000 meq | EXTENDED_RELEASE_TABLET | Freq: Once | ORAL | Status: AC
  Administered 2018-11-06: 40 meq via ORAL
  Filled 2018-11-06: qty 2

## 2018-11-06 MED ORDER — SUMATRIPTAN SUCCINATE 50 MG PO TABS
50.0000 mg | ORAL_TABLET | Freq: Three times a day (TID) | ORAL | Status: DC | PRN
  Administered 2018-11-06 – 2018-11-08 (×2): 50 mg via ORAL
  Filled 2018-11-06 (×4): qty 1

## 2018-11-06 NOTE — Progress Notes (Signed)
Inpatient Diabetes Program Recommendations  AACE/ADA: New Consensus Statement on Inpatient Glycemic Control (2015)  Target Ranges:  Prepandial:   less than 140 mg/dL      Peak postprandial:   less than 180 mg/dL (1-2 hours)      Critically ill patients:  140 - 180 mg/dL   Lab Results  Component Value Date   HGBA1C 6.2 11/09/2011    Review of Glycemic Control Results for Mathew Porter, Mathew Porter (MRN [id number]) as of 11/06/2018 12:12  Ref. Range 11/06/2018 05:10  Glucose Latest Ref Range: 70 - 99 mg/dL 449 (H)   Diabetes history: None Inpatient Diabetes Program Recommendations:   No previous history of DM noted. Patient is currently on IV steroids once daily. Lab glucose>200 mg/dL.  Consider adding A1C to current labs.  Also may consider adding CBG's tid with meals and HS with Novolog moderate correction.   Thanks,  Beryl Meager, RN, BC-ADM Inpatient Diabetes Coordinator Pager [phone] (8a-5p)

## 2018-11-06 NOTE — Progress Notes (Signed)
[facility]   PATIENT NAME: Mathew Porter    MR#:  [id number]  DATE OF BIRTH:  30-Sep-1955  SUBJECTIVE:  CHIEF COMPLAINT:   Chief Complaint  Patient presents with  . Shortness of Breath  . Leg Swelling   Came with left leg swelling and redness.  Admitted for cellulitis the swelling is slightly better today.  He has complain of severe headache and says that he has complaints of migraine chronically.  REVIEW OF SYSTEMS:  CONSTITUTIONAL: No fever, fatigue or weakness.  EYES: No blurred or double vision.  EARS, NOSE, AND THROAT: No tinnitus or ear pain.  RESPIRATORY: No cough, shortness of breath, wheezing or hemoptysis.  CARDIOVASCULAR: No chest pain, orthopnea, edema.  GASTROINTESTINAL: No nausea, vomiting, diarrhea or abdominal pain.  GENITOURINARY: No dysuria, hematuria.  ENDOCRINE: No polyuria, nocturia,  HEMATOLOGY: No anemia, easy bruising or bleeding SKIN: No rash or lesion. MUSCULOSKELETAL: No joint pain or arthritis.   NEUROLOGIC: No tingling, numbness, weakness.  PSYCHIATRY: No anxiety or depression.   ROS  DRUG ALLERGIES:  No Known Allergies  VITALS:  Blood pressure (!) 113/54, pulse 71, temperature 98.1 F (36.7 C), temperature source Oral, resp. rate 18, height 5\' 11"  (1.803 m), weight (!) 142.9 kg, SpO2 94 %.  PHYSICAL EXAMINATION:  GENERAL:  63 y.o.-year-old obese patient in the bed with no acute distress.  EYES: Pupils equal, round, reactive to light and accommodation. No scleral icterus. Extraocular muscles intact.  HEENT: Head atraumatic, normocephalic. Oropharynx and nasopharynx clear.  NECK:  Supple, no jugular venous distention. No thyroid enlargement, no tenderness.  LUNGS: Normal breath sounds bilaterally, no wheezing, rales,rhonchi or crepitation. No use of accessory muscles of respiration.  CARDIOVASCULAR: S1, S2 normal. No murmurs, rubs, or gallops.  ABDOMEN: Soft, nontender, nondistended. Bowel sounds  present. No organomegaly or mass.  EXTREMITIES: Left leg and foot is swollen and red with some blisters oozing clear liquid.  NEUROLOGIC: Cranial nerves II through XII are intact. Muscle strength 5/5 in all extremities. Sensation intact. Gait not checked.  PSYCHIATRIC: The patient is alert and oriented x 3.  SKIN: No obvious rash, lesion, or ulcer.   Physical Exam LABORATORY PANEL:   CBC Recent Labs  Lab 11/06/18 0510  WBC 19.0*  HGB 13.1  HCT 39.8  PLT 212   ------------------------------------------------------------------------------------------------------------------  Chemistries  Recent Labs  Lab 11/06/18 0510  NA 134*  K 3.4*  CL 101  CO2 21*  GLUCOSE 226*  BUN 13  CREATININE 0.91  CALCIUM 8.0*   ------------------------------------------------------------------------------------------------------------------  Cardiac Enzymes Recent Labs  Lab 11/05/18 1137  TROPONINI <0.03   ------------------------------------------------------------------------------------------------------------------  RADIOLOGY:  Dg Chest 2 View  Result Date: 11/05/2018 CLINICAL DATA:  Shortness of breath and tachycardia EXAM: CHEST - 2 VIEW COMPARISON:  November 09, 2011 FINDINGS: There is no edema or consolidation. Heart size and pulmonary vascularity are normal. No adenopathy. No bone lesions. IMPRESSION: No edema or consolidation. Electronically Signed   By: Bretta Bang III M.D.   On: 11/05/2018 12:08   US Venous Img Lower Unilateral Left  Result Date: 11/05/2018 CLINICAL DATA:  Left leg swelling for 2 days EXAM: LEFT LOWER EXTREMITY VENOUS DOPPLER ULTRASOUND TECHNIQUE: Gray-scale sonography with graded compression, as well as color Doppler and duplex ultrasound were performed to evaluate the lower extremity deep venous systems from the level of the common femoral vein and including the common femoral, femoral, profunda femoral, popliteal and calf veins including the posterior  tibial, peroneal and gastrocnemius  veins when visible. The superficial great saphenous vein was also interrogated. Spectral Doppler was utilized to evaluate flow at rest and with distal augmentation maneuvers in the common femoral, femoral and popliteal veins. COMPARISON:  11/01/2009 FINDINGS: Contralateral Common Femoral Vein: Respiratory phasicity is normal and symmetric with the symptomatic side. No evidence of thrombus. Normal compressibility. Common Femoral Vein: No evidence of thrombus. Normal compressibility, respiratory phasicity and response to augmentation. Saphenofemoral Junction: No evidence of thrombus. Normal compressibility and flow on color Doppler imaging. Profunda Femoral Vein: No evidence of thrombus. Normal compressibility and flow on color Doppler imaging. Femoral Vein: No evidence of thrombus. Normal compressibility, respiratory phasicity and response to augmentation. Popliteal Vein: No evidence of thrombus. Normal compressibility, respiratory phasicity and response to augmentation. Calf Veins: No evidence of thrombus. Normal compressibility and flow on color Doppler imaging. Superficial Great Saphenous Vein: No evidence of thrombus. Normal compressibility. Venous Reflux:  None. Other Findings:  None. IMPRESSION: No evidence of deep venous thrombosis. Electronically Signed   By: Alcide Clever M.D.   On: 11/05/2018 14:19    ASSESSMENT AND PLAN:   Active Problems:   Left leg cellulitis  *Acute left lower extremity cellulitis and sepsis with extensive redness all over the foot and leg extending up to the knee vancomycin and Unasyn.  Blood cultures.  Lactic acid elevated and have ordered a bolus.  Ultrasound shows no DVT or abscess  * Acute bronchitis with Bilateral wheezing.  Influenza checked and negative.  steroids.  Nebulizers as needed.  Nothing acute on chest x-ray. Feels better now.  *Migraine headache Fioricet is not helping much, give Imitrex.  *Hypertension.  Continue  home medications per.  *DVT prophylaxis with Lovenox    All the records are reviewed and case discussed with Care Management/Social Workerr. Management plans discussed with the patient, family and they are in agreement.  CODE STATUS: Full code  TOTAL TIME TAKING CARE OF THIS PATIENT: 35 minutes.     POSSIBLE D/C IN 1-2 DAYS, DEPENDING ON CLINICAL CONDITION.   Altamese Dilling M.D on 11/06/2018   Between 7am to 6pm - Pager - [phone]  After 6pm go to [email or web address] - password [email or web address]  [facility]  Office  [phone]  CC: Primary care physician; Jerl Mina, MD  Note: This dictation was prepared with Dragon dictation along with smaller phrase technology. Any transcriptional errors that result from this process are unintentional.

## 2018-11-07 ENCOUNTER — Inpatient Hospital Stay: Payer: Managed Care, Other (non HMO)

## 2018-11-07 LAB — BASIC METABOLIC PANEL
Anion gap: 9 (ref 5–15)
BUN: 17 mg/dL (ref 8–23)
CO2: 23 mmol/L (ref 22–32)
Calcium: 7.8 mg/dL — ABNORMAL LOW (ref 8.9–10.3)
Chloride: 101 mmol/L (ref 98–111)
Creatinine, Ser: 0.95 mg/dL (ref 0.61–1.24)
GFR calc Af Amer: >60 mL/min (ref >60)
GFR calc non Af Amer: >60 mL/min (ref >60)
GLUCOSE: 253 mg/dL — AB (ref 70–99)
Potassium: 3.8 mmol/L (ref 3.5–5.1)
Sodium: 133 mmol/L — ABNORMAL LOW (ref 135–145)

## 2018-11-07 LAB — GLUCOSE, CAPILLARY
Glucose-Capillary: 209 mg/dL — ABNORMAL HIGH (ref 70–99)
Glucose-Capillary: 257 mg/dL — ABNORMAL HIGH (ref 70–99)
Glucose-Capillary: 219 mg/dL — ABNORMAL HIGH (ref 70–99)
Glucose-Capillary: 165 mg/dL — ABNORMAL HIGH (ref 70–99)

## 2018-11-07 LAB — PROCALCITONIN: Procalcitonin: 1.71 ng/mL

## 2018-11-07 LAB — CBC
HCT: 37.5 % — ABNORMAL LOW (ref 39.0–52.0)
Hemoglobin: 12.3 g/dL — ABNORMAL LOW (ref 13.0–17.0)
MCH: 28.3 pg (ref 26.0–34.0)
MCHC: 32.8 g/dL (ref 30.0–36.0)
MCV: 86.4 fL (ref 80.0–100.0)
Platelets: 233 10*3/uL (ref 150–400)
RBC: 4.34 MIL/uL (ref 4.22–5.81)
RDW: 15 % (ref 11.5–15.5)
WBC: 21.1 10*3/uL — ABNORMAL HIGH (ref 4.0–10.5)
nRBC: 0 % (ref 0.0–0.2)

## 2018-11-07 LAB — HEMOGLOBIN A1C
HEMOGLOBIN A1C: 7.1 % — AB (ref 4.8–5.6)
Mean Plasma Glucose: 157.07 mg/dL

## 2018-11-07 LAB — HIV ANTIBODY (ROUTINE TESTING W REFLEX): HIV Screen 4th Generation wRfx: NONREACTIVE

## 2018-11-07 LAB — MRSA PCR SCREENING: MRSA by PCR: NEGATIVE

## 2018-11-07 MED ORDER — ENOXAPARIN SODIUM 40 MG/0.4ML ~~LOC~~ SOLN
40.0000 mg | Freq: Two times a day (BID) | SUBCUTANEOUS | Status: DC
  Administered 2018-11-07 – 2018-11-08 (×3): 40 mg via SUBCUTANEOUS
  Filled 2018-11-07 (×3): qty 0.4

## 2018-11-07 MED ORDER — SODIUM CHLORIDE 0.9 % IV SOLN
INTRAVENOUS | Status: DC | PRN
  Administered 2018-11-07: 18:00:00 via INTRAVENOUS

## 2018-11-07 MED ORDER — TRAZODONE HCL 50 MG PO TABS
50.0000 mg | ORAL_TABLET | Freq: Every evening | ORAL | Status: DC | PRN
  Administered 2018-11-07 (×2): 50 mg via ORAL
  Filled 2018-11-07 (×2): qty 1

## 2018-11-07 MED ORDER — INSULIN ASPART 100 UNIT/ML ~~LOC~~ SOLN
0.0000 [IU] | Freq: Three times a day (TID) | SUBCUTANEOUS | Status: DC
  Administered 2018-11-07 (×2): 5 [IU] via SUBCUTANEOUS
  Administered 2018-11-07: 8 [IU] via SUBCUTANEOUS
  Administered 2018-11-08: 12:00:00 2 [IU] via SUBCUTANEOUS
  Administered 2018-11-08 (×2): 3 [IU] via SUBCUTANEOUS
  Filled 2018-11-07 (×6): qty 1

## 2018-11-07 MED ORDER — LIVING WELL WITH DIABETES BOOK
Freq: Once | Status: AC
  Administered 2018-11-07: 18:00:00

## 2018-11-07 MED ORDER — INSULIN ASPART 100 UNIT/ML ~~LOC~~ SOLN: 0.0000 [IU] | Freq: Every day | SUBCUTANEOUS | Status: DC

## 2018-11-07 NOTE — Progress Notes (Signed)
Inpatient Diabetes Program Recommendations  AACE/ADA: New Consensus Statement on Inpatient Glycemic Control (2015)  Target Ranges:  Prepandial:   less than 140 mg/dL      Peak postprandial:   less than 180 mg/dL (1-2 hours)      Critically ill patients:  140 - 180 mg/dL   Lab Results  Component Value Date   GLUCAP 257 (H) 11/07/2018   HGBA1C 7.1 (H) 11/07/2018    Results for Mathew Porter, Mathew Porter (MRN [id number]) as of 11/07/2018 16:23  Ref. Range 11/07/2018 08:11 11/07/2018 12:05  Glucose-Capillary Latest Ref Range: 70 - 99 mg/dL 784 (H) 696 (H)    Diabetes history: None Outpatient Diabetes medications: None Current orders for Inpatient glycemic control:  Novolog moderate tid with meals and HS  Inpatient Diabetes Program Recommendations:    Note elevated A1C with no history of DM.  Discussed A1C results with patient and standards of care related to DM.  Patient was unaware of elevated A1C and surprised.  Encouraged f/u with PCP regarding elevated A1C.  Blood sugars likely higher due to steroids that were given on 11/06/18. We briefly discussed lifestyle modifications such as eliminating sugar from beverages, limiting portions, and exercise.  Also ordered Living well with DM booklet for patient.    - MD at d/c may consider adding Metformin 500 mg bid and have patient f/u with PCP ASAP.    Thanks,  Beryl Meager, RN, BC-ADM Inpatient Diabetes Coordinator Pager [phone] (8a-5p)

## 2018-11-07 NOTE — Progress Notes (Signed)
[employer] [facility] [place] [facility]   PATIENT NAME: Mathew Porter    MR#:  [id number]  DATE OF BIRTH:  Aug 14, 1956  SUBJECTIVE:  CHIEF COMPLAINT:   Chief Complaint  Patient presents with  . Shortness of Breath  . Leg Swelling   Came with left leg swelling and redness.  Admitted for cellulitis the swelling is slightly better today.  He has complain of severe headache and says that he has complaints of migraine chronically.  headache is better now, no SOB. Leg swelling is some better.  REVIEW OF SYSTEMS:  CONSTITUTIONAL: No fever, fatigue or weakness.  EYES: No blurred or double vision.  EARS, NOSE, AND THROAT: No tinnitus or ear pain.  RESPIRATORY: No cough, shortness of breath, wheezing or hemoptysis.  CARDIOVASCULAR: No chest pain, orthopnea, edema.  GASTROINTESTINAL: No nausea, vomiting, diarrhea or abdominal pain.  GENITOURINARY: No dysuria, hematuria.  ENDOCRINE: No polyuria, nocturia,  HEMATOLOGY: No anemia, easy bruising or bleeding SKIN: No rash or lesion. MUSCULOSKELETAL: No joint pain or arthritis.   NEUROLOGIC: No tingling, numbness, weakness.  PSYCHIATRY: No anxiety or depression.   ROS  DRUG ALLERGIES:  No Known Allergies  VITALS:  Blood pressure (!) 147/80, pulse 95, temperature 98.1 F (36.7 C), temperature source Oral, resp. rate 18, height 5\' 11"  (1.803 m), weight (!) 156.1 kg, SpO2 95 %.  PHYSICAL EXAMINATION:  GENERAL:  63 y.o.-year-old obese patient in the bed with no acute distress.  EYES: Pupils equal, round, reactive to light and accommodation. No scleral icterus. Extraocular muscles intact.  HEENT: Head atraumatic, normocephalic. Oropharynx and nasopharynx clear.  NECK:  Supple, no jugular venous distention. No thyroid enlargement, no tenderness.  LUNGS: Normal breath sounds bilaterally, no wheezing, rales,rhonchi or crepitation. No use of accessory muscles of respiration.  CARDIOVASCULAR: S1, S2 normal. No murmurs, rubs, or  gallops.  ABDOMEN: Soft, nontender, nondistended. Bowel sounds present. No organomegaly or mass.  EXTREMITIES: Left leg and foot is swollen and red with some blisters oozing clear liquid.  NEUROLOGIC: Cranial nerves II through XII are intact. Muscle strength 5/5 in all extremities. Sensation intact. Gait not checked.  PSYCHIATRIC: The patient is alert and oriented x 3.  SKIN: No obvious rash, lesion, or ulcer.   Physical Exam LABORATORY PANEL:   CBC Recent Labs  Lab 11/07/18 0417  WBC 21.1*  HGB 12.3*  HCT 37.5*  PLT 233   ------------------------------------------------------------------------------------------------------------------  Chemistries  Recent Labs  Lab 11/07/18 0417  NA 133*  K 3.8  CL 101  CO2 23  GLUCOSE 253*  BUN 17  CREATININE 0.95  CALCIUM 7.8*   ------------------------------------------------------------------------------------------------------------------  Cardiac Enzymes Recent Labs  Lab 11/05/18 1137  TROPONINI <0.03   ------------------------------------------------------------------------------------------------------------------  RADIOLOGY:  Dg Chest 2 View  Result Date: 11/07/2018 CLINICAL DATA:  63 year old with hypoxia. EXAM: CHEST - 2 VIEW COMPARISON:  11/05/2018 and 11/09/2011 FINDINGS: New patchy densities in the mid and lower right lung are concerning for new airspace disease and pneumonia. Questionable rounded opacity in the peripheral mid/upper aspect of the right lung. Similarly, there is a questionable rounded opacity along the periphery of the left lower lung. These rounded opacities could be related to overlying soft tissue but nonspecific. Concern for opacity in superior segment of the right lower lobe based on the lateral view. No large pleural effusions. Heart size is stable. Trachea is midline. IMPRESSION: New patchy densities in the right lower lung and questionable bilateral peripheral rounded opacities as described.  Findings are concerning for pneumonia. Rounded  opacities are more indeterminate and could be related to overlying soft tissues. Recommend continued follow-up. Electronically Signed   By: Richarda Overlie M.D.   On: 11/07/2018 09:26    ASSESSMENT AND PLAN:   Active Problems:   Left leg cellulitis  *Acute left lower extremity cellulitis and sepsis with extensive redness all over the foot and leg extending up to the knee vancomycin and Unasyn.  Blood cultures.  Lactic acid elevated and have ordered a bolus.  Ultrasound shows no DVT or abscess We will get his leg wrapped with elastic bandage to help some reduction in the swelling.  * Community-acquired pneumonia- bilateral, Influenza checked and negative.  steroids.  Nebulizers as needed.    Chest x-ray was clear on admission but now shows developing pneumonia bilateral. Feels better now.  He is not short of breath and oxygen saturation is stable. Procalcitonin is high.  He is already on Vanco and Unasyn. I will check MRSA PCR.  *Migraine headache Fioricet is not helping much, give Imitrex. Headache resolved with Imitrex.  *Hypertension.  Continue home medications per.  *DVT prophylaxis with Lovenox    All the records are reviewed and case discussed with Care Management/Social Workerr. Management plans discussed with the patient, family and they are in agreement.  CODE STATUS: Full code  TOTAL TIME TAKING CARE OF THIS PATIENT: 35 minutes.     POSSIBLE D/C IN 1-2 DAYS, DEPENDING ON CLINICAL CONDITION.   Altamese Dilling M.D on 11/07/2018   Between 7am to 6pm - Pager - [phone]  After 6pm go to [email or web address] - password [email or web address]  [facility]  Office  [phone]  CC: Primary care physician; Mathew Mina, MD  Note: This dictation was prepared with Dragon dictation along with smaller phrase technology. Any transcriptional errors that result from this process are unintentional.

## 2018-11-07 NOTE — Progress Notes (Signed)
Pt was short of breath during night with an oxygen level of 92%. Put patient on 2L and O2 went up to 95%. Will continue to monitor.

## 2018-11-07 NOTE — Progress Notes (Signed)
PHARMACIST - PHYSICIAN COMMUNICATION  CONCERNING:  Enoxaparin (Lovenox) for DVT Prophylaxis   RECOMMENDATION: Patient was prescribed enoxaprin 40mg  q24 hours for VTE prophylaxis.   Filed Weights   11/05/18 1125 11/07/18 0411  Weight: (!) 315 lb (142.9 kg) (!) 344 lb 2.2 oz (156.1 kg)    Body mass index is 48 kg/m.  Estimated Creatinine Clearance: 124.3 mL/min (by C-G formula based on SCr of 0.95 mg/dL).  Based on [facility] policy patient is candidate for enoxaparin 40mg  every 12 hour dosing due to BMI being >40.  DESCRIPTION: Pharmacy has adjusted enoxaparin dose per [place]/[facility] policy.  Patient is now receiving enoxaparin 40mg  every 12 hours.   Gardner Candle, PharmD, BCPS Clinical Pharmacist 11/07/2018 6:15 AM

## 2018-11-07 NOTE — Progress Notes (Signed)
Care of patient taken over from [place], [place].  Patient resting comfortably in bed.  Orson Ape, BSN

## 2018-11-08 LAB — BASIC METABOLIC PANEL
Anion gap: 8 (ref 5–15)
BUN: 16 mg/dL (ref 8–23)
CO2: 24 mmol/L (ref 22–32)
Calcium: 7.8 mg/dL — ABNORMAL LOW (ref 8.9–10.3)
Chloride: 102 mmol/L (ref 98–111)
Creatinine, Ser: 0.79 mg/dL (ref 0.61–1.24)
GFR calc Af Amer: >60 mL/min (ref >60)
GFR calc non Af Amer: >60 mL/min (ref >60)
Glucose, Bld: 171 mg/dL — ABNORMAL HIGH (ref 70–99)
Potassium: 4.9 mmol/L (ref 3.5–5.1)
SODIUM: 134 mmol/L — AB (ref 135–145)

## 2018-11-08 LAB — CBC
HCT: 40.1 % (ref 39.0–52.0)
Hemoglobin: 13.3 g/dL (ref 13.0–17.0)
MCH: 29.2 pg (ref 26.0–34.0)
MCHC: 33.2 g/dL (ref 30.0–36.0)
MCV: 87.9 fL (ref 80.0–100.0)
Platelets: 256 10*3/uL (ref 150–400)
RBC: 4.56 MIL/uL (ref 4.22–5.81)
RDW: 15.9 % — ABNORMAL HIGH (ref 11.5–15.5)
WBC: 16.5 10*3/uL — ABNORMAL HIGH (ref 4.0–10.5)
nRBC: 0 % (ref 0.0–0.2)

## 2018-11-08 LAB — GLUCOSE, CAPILLARY
Glucose-Capillary: 155 mg/dL — ABNORMAL HIGH (ref 70–99)
Glucose-Capillary: 145 mg/dL — ABNORMAL HIGH (ref 70–99)
Glucose-Capillary: 155 mg/dL — ABNORMAL HIGH (ref 70–99)

## 2018-11-08 LAB — PROCALCITONIN: Procalcitonin: 1.2 ng/mL

## 2018-11-08 MED ORDER — SUMATRIPTAN SUCCINATE 50 MG PO TABS: 50.0000 mg | ORAL_TABLET | Freq: Three times a day (TID) | ORAL | 0 refills | Status: AC | PRN

## 2018-11-08 MED ORDER — PANTOPRAZOLE SODIUM 40 MG PO TBEC: 40.0000 mg | DELAYED_RELEASE_TABLET | Freq: Every day | ORAL | 0 refills | Status: DC

## 2018-11-08 MED ORDER — METFORMIN HCL 500 MG PO TABS: 500.0000 mg | ORAL_TABLET | Freq: Two times a day (BID) | ORAL | 0 refills | Status: DC

## 2018-11-08 MED ORDER — DOXYCYCLINE HYCLATE 100 MG PO CAPS: 100.0000 mg | ORAL_CAPSULE | Freq: Two times a day (BID) | ORAL | 0 refills | Status: AC

## 2018-11-08 MED ORDER — AMOXICILLIN-POT CLAVULANATE 875-125 MG PO TABS: 1.0000 | ORAL_TABLET | Freq: Two times a day (BID) | ORAL | 0 refills | Status: AC

## 2018-11-08 NOTE — Discharge Summary (Signed)
[facility] [place] [facility]   PATIENT NAME: Mathew Porter    MR#:  [id number]  DATE OF BIRTH:  1956-04-15  DATE OF ADMISSION:  11/05/2018 ADMITTING PHYSICIAN: Milagros Loll, MD  DATE OF DISCHARGE: 11/08/2018  PRIMARY CARE PHYSICIAN: Jerl Mina, MD    ADMISSION DIAGNOSIS:  Cellulitis of left lower extremity [L03.116] Sepsis without acute organ dysfunction, due to unspecified organism (HCC) [A41.9]  DISCHARGE DIAGNOSIS:  Active Problems:   Left leg cellulitis   SECONDARY DIAGNOSIS:   Past Medical History:  Diagnosis Date  . Anxiety   . HTN (hypertension)   . Obesity   . Sleep apnea     HOSPITAL COURSE:   *Acute left lower extremity cellulitisand sepsiswith extensive redness all over the foot and leg extending up to the knee vancomycin and Unasyn. Blood cultures. Lactic acid elevated and have ordered a bolus.  Ultrasound shows no DVT or abscess We will get his leg wrapped with elastic bandage to help some reduction in the swelling. Wound care nurse had seen him and advised to have dressing which we are providing some material from here. We are going to give him Augmentin and doxycycline on discharge.  *Community-acquired pneumonia- bilateral,Influenza checked and negative.  steroids. Nebulizers as needed.   Chest x-ray was clear on admission but now shows developing pneumonia bilateral. Feels better now.  He is not short of breath and oxygen saturation is stable. Procalcitonin is high.  He is already on Vanco and Unasyn. He is going to be on antibiotics for 12 days for cellulitis.  *Migraine headache Fioricet is not helping much, give Imitrex. Headache resolved with Imitrex.  *Hypertension. Continue home medications per.  *DVT prophylaxis with Lovenox  *Diabetes Started on metformin and advised to follow with primary care.  DISCHARGE CONDITIONS:   Stable  CONSULTS OBTAINED:    DRUG ALLERGIES:  No Known  Allergies  DISCHARGE MEDICATIONS:   Allergies as of 11/08/2018   No Known Allergies     Medication List    STOP taking these medications   levofloxacin 250 MG tablet Commonly known as:  LEVAQUIN     TAKE these medications   amoxicillin-clavulanate 875-125 MG tablet Commonly known as:  Augmentin Take 1 tablet by mouth 2 (two) times daily for 12 days.   aspirin-acetaminophen-caffeine 250-250-65 MG tablet Commonly known as:  EXCEDRIN MIGRAINE Take by mouth.   doxycycline 100 MG capsule Commonly known as:  VIBRAMYCIN Take 1 capsule (100 mg total) by mouth 2 (two) times daily for 12 days.   metFORMIN 500 MG tablet Commonly known as:  Glucophage Take 1 tablet (500 mg total) by mouth 2 (two) times daily with a meal.   oxybutynin 15 MG 24 hr tablet Commonly known as:  DITROPAN XL Take 1 tablet (15 mg total) by mouth at bedtime.   pantoprazole 40 MG tablet Commonly known as:  PROTONIX Take 1 tablet (40 mg total) by mouth daily. Start taking on:  November 09, 2018   simvastatin 20 MG tablet Commonly known as:  ZOCOR TAKE 1 TABLET BY MOUTH  NIGHTLY   SUMAtriptan 50 MG tablet Commonly known as:  IMITREX Take 1 tablet (50 mg total) by mouth 3 (three) times daily as needed for migraine or headache. May repeat in 2 hours if headache persists or recurs.   tamsulosin 0.4 MG Caps capsule Commonly known as:  FLOMAX Take by mouth.   verapamil 240 MG CR tablet Commonly known as:  CALAN-SR TAKE 1 TABLET BY  MOUTH ONCE DAILY        DISCHARGE INSTRUCTIONS:    Follow with primary care physician in 1 week.  If you experience worsening of your admission symptoms, develop shortness of breath, life threatening emergency, suicidal or homicidal thoughts you must seek medical attention immediately by calling 911 or calling your MD immediately  if symptoms less severe.  You Must read complete instructions/literature along with all the possible adverse reactions/side effects for all the  Medicines you take and that have been prescribed to you. Take any new Medicines after you have completely understood and accept all the possible adverse reactions/side effects.   Please note  You were cared for by a hospitalist during your hospital stay. If you have any questions about your discharge medications or the care you received while you were in the hospital after you are discharged, you can call the unit and asked to speak with the hospitalist on call if the hospitalist that took care of you is not available. Once you are discharged, your primary care physician will handle any further medical issues. Please note that NO REFILLS for any discharge medications will be authorized once you are discharged, as it is imperative that you return to your primary care physician (or establish a relationship with a primary care physician if you do not have one) for your aftercare needs so that they can reassess your need for medications and monitor your lab values.    Today   CHIEF COMPLAINT:   Chief Complaint  Patient presents with  . Shortness of Breath  . Leg Swelling    HISTORY OF PRESENT ILLNESS:  Mathew Porter  is a 63 y.o. male with a known history of hypertension, obesity, sleep apnea, anxiety presents to the emergency room complaining of 3 days of not feeling well.  Patient initially have chills, sinus congestion, headache.  Then started developing fever and chills.  He noticed significant swelling and redness of his left lower extremity.  Today he has extensive redness all over his foot extending up the leg up to the knee.  Found to have lactic acid of 2, tachycardia.  He also complains of burning in his chest. Patient has no recent travel history. Works as a Magazine features editor at [employer].   VITAL SIGNS:  Blood pressure (!) 143/79, pulse 89, temperature 98.2 F (36.8 C), temperature source Oral, resp. rate 18, height  (1.803 m), weight (!) 155 kg, SpO2 94 %.  I/O:     Intake/Output Summary (Last 24 hours) at 11/08/2018 1701 Last data filed at 11/08/2018 1411 Gross per 24 hour  Intake 1644.35 ml  Output -  Net 1644.35 ml    PHYSICAL EXAMINATION:  GENERAL:  63 y.o.-year-old obese patient in the bed with no acute distress.  EYES: Pupils equal, round, reactive to light and accommodation. No scleral icterus. Extraocular muscles intact.  HEENT: Head atraumatic, normocephalic. Oropharynx and nasopharynx clear.  NECK:  Supple, no jugular venous distention. No thyroid enlargement, no tenderness.  LUNGS: Normal breath sounds bilaterally, no wheezing, rales,rhonchi or crepitation. No use of accessory muscles of respiration.  CARDIOVASCULAR: S1, S2 normal. No murmurs, rubs, or gallops.  ABDOMEN: Soft, nontender, nondistended. Bowel sounds present. No organomegaly or mass.  EXTREMITIES: Left leg and foot is swollen and red with some blisters oozing clear liquid.  NEUROLOGIC: Cranial nerves II through XII are intact. Muscle strength 5/5 in all extremities. Sensation intact. Gait not checked.  PSYCHIATRIC: The patient is alert and oriented x 3.  SKIN:  No obvious rash, lesion, or ulcer.   DATA REVIEW:   CBC Recent Labs  Lab 11/08/18 0505  WBC 16.5*  HGB 13.3  HCT 40.1  PLT 256    Chemistries  Recent Labs  Lab 11/08/18 0505  NA 134*  K 4.9  CL 102  CO2 24  GLUCOSE 171*  BUN 16  CREATININE 0.79  CALCIUM 7.8*    Cardiac Enzymes Recent Labs  Lab 11/05/18 1137  TROPONINI <0.03    Microbiology Results  Results for orders placed or performed during the hospital encounter of 11/05/18  Culture, blood (routine x 2)     Status: None (Preliminary result)   Collection Time: 11/05/18  3:03 PM  Result Value Ref Range Status   Specimen Description BLOOD LEFT ANTECUBITAL  Final   Special Requests   Final    BOTTLES DRAWN AEROBIC AND ANAEROBIC Blood Culture results may not be optimal due to an excessive volume of blood received in culture bottles    Culture   Final    NO GROWTH 3 DAYS Performed at [facility], [address] [place], [place] [address]    Report Status PENDING  Incomplete  Culture, blood (routine x 2)     Status: None (Preliminary result)   Collection Time: 11/05/18  6:11 PM  Result Value Ref Range Status   Specimen Description BLOOD BLOOD LEFT HAND  Final   Special Requests   Final    BOTTLES DRAWN AEROBIC AND ANAEROBIC Blood Culture results may not be optimal due to an inadequate volume of blood received in culture bottles   Culture   Final    NO GROWTH 3 DAYS Performed at [facility], [address] [place], [place] [address]    Report Status PENDING  Incomplete  MRSA PCR Screening     Status: None   Collection Time: 11/07/18  5:39 PM  Result Value Ref Range Status   MRSA by PCR NEGATIVE NEGATIVE Final    Comment:        The GeneXpert MRSA Assay (FDA approved for NASAL specimens only), is one component of a comprehensive MRSA colonization surveillance program. It is not intended to diagnose MRSA infection nor to guide or monitor treatment for MRSA infections. Performed at [facility], [address] [place], [place] [address]     RADIOLOGY:  Dg Chest 2 View  Result Date: 11/07/2018 CLINICAL DATA:  63 year old with hypoxia. EXAM: CHEST - 2 VIEW COMPARISON:  11/05/2018 and 11/09/2011 FINDINGS: New patchy densities in the mid and lower right lung are concerning for new airspace disease and pneumonia. Questionable rounded opacity in the peripheral mid/upper aspect of the right lung. Similarly, there is a questionable rounded opacity along the periphery of the left lower lung. These rounded opacities could be related to overlying soft tissue but nonspecific. Concern for opacity in superior segment of the right lower lobe based on the lateral view. No large pleural effusions. Heart size is stable. Trachea is midline. IMPRESSION: New patchy densities in the right lower lung and  questionable bilateral peripheral rounded opacities as described. Findings are concerning for pneumonia. Rounded opacities are more indeterminate and could be related to overlying soft tissues. Recommend continued follow-up. Electronically Signed   By: Richarda OverlieAdam  Henn M.D.   On: 11/07/2018 09:26    EKG:   Orders placed or performed during the hospital encounter of 11/05/18  . ED EKG  . ED EKG  . EKG 12-Lead  . EKG 12-Lead  Management plans discussed with the patient, family and they are in agreement.  CODE STATUS:     Code Status Orders  (From admission, onward)         Start     Ordered   11/05/18 1607  Full code  Continuous     11/05/18 1607        Code Status History    This patient has a current code status but no historical code status.      TOTAL TIME TAKING CARE OF THIS PATIENT: *35 minutes.    Altamese Dilling M.D on 11/08/2018 at 5:01 PM  Between 7am to 6pm - Pager - [phone]  After 6pm go to [email or web address] - password [email or web address]  [facility]  Office  [phone]  CC: Primary care physician; Jerl Mina, MD   Note: This dictation was prepared with Dragon dictation along with smaller phrase technology. Any transcriptional errors that result from this process are unintentional.

## 2018-11-08 NOTE — Consult Note (Signed)
WOC Nurse wound consult note Reason for Consult: left leg Patient with cellulitis LLE, does not report any injury to the leg. Very painful. No DVT. Wound type: open partial thickness skin loss over the pretibial and medial calf with some intact serous filled bulla  Pressure Injury POA: NA Measurement: scattered Wound bed: clean, some ruddiness with skin loss Drainage (amount, consistency, odor) large amounts of serous drainage. Nonpurulent, no odor. Drainage has saturated bed linens Periwound: erythema that to the knee and is circumferential, warm to the touch  Dressing procedure/placement/frequency: Add hydroconductive dressing for excessive drainage today. Will have nursing place on open areas. Add xeroform to the intact bulla. Cover with ABD pads, secure with kerlix.  Patient has had staff remove dressing over the last 24 hours due to discomfort. So will not be surprised if he continues to do this.   Elevate extremity if possible. This is a big fellow and his body habitus may limit the ability to raise extremity.  Compression therapy would be contraindicated in the presence of active cellulitis that does not appear to improved significantly since admission.   Consider HHRN for evaluation for compression wraps/ 4 layer at home once cellulitis has resolved.  Based on appearance of both of the LE would suggest bilateral compression long term for management of venous stasis.   Discussed use of dressings with bedside nurse and requested dressing be sent to the department for use from the materials department.   Would discontinue use of hydroconductive dressing once drainage has decrease. Orders updated to reflex this.    Discussed POC with patient and bedside nurse.  Re consult if needed, will not follow at this time. Thanks  Garik Diamant [employer], RN,CWOCN, CNS, CWON-AP [phone])

## 2018-11-08 NOTE — Discharge Instructions (Signed)
Dressing on leg- Apply Drawtex 4x4 dressings ([id number] # [phone]) to open weeping areas pretibial and medial calf, cover with ABD pads, secure with kerlix.  Apply xeroform to the intact blisters, also covered with ABD pads.  Change every other day if possible. If no Drawtex available for use cover all areas with xeroform gauze, ABD pads, and kerlix.

## 2018-11-08 NOTE — Progress Notes (Signed)
Dressing to LLE changed per order.  Pt educated about dressing change, verbalized understanding.  Dressing supply given.  Pt is being discharge home.  Discharge papers given and explained to pt, verbalized understanding. Meds and f/u appointments reviewed with pt. Rx given. Awaiting transportation.

## 2018-11-10 LAB — CULTURE, BLOOD (ROUTINE X 2)
Culture: NO GROWTH
Culture: NO GROWTH

## 2018-11-26 ENCOUNTER — Ambulatory Visit: Payer: 59 | Admitting: Urology

## 2018-12-14 ENCOUNTER — Other Ambulatory Visit: Payer: Self-pay | Admitting: Urology

## 2018-12-17 ENCOUNTER — Other Ambulatory Visit: Payer: Self-pay

## 2018-12-17 DIAGNOSIS — N3281 Overactive bladder: Secondary | ICD-10-CM

## 2018-12-17 DIAGNOSIS — R351 Nocturia: Secondary | ICD-10-CM

## 2018-12-17 MED ORDER — OXYBUTYNIN CHLORIDE ER 15 MG PO TB24: ORAL_TABLET | ORAL | 1 refills | Status: DC

## 2019-01-28 ENCOUNTER — Ambulatory Visit (INDEPENDENT_AMBULATORY_CARE_PROVIDER_SITE_OTHER): Payer: Managed Care, Other (non HMO) | Admitting: Vascular Surgery

## 2019-01-28 ENCOUNTER — Encounter (INDEPENDENT_AMBULATORY_CARE_PROVIDER_SITE_OTHER): Payer: Self-pay | Admitting: Vascular Surgery

## 2019-01-28 ENCOUNTER — Other Ambulatory Visit: Payer: Self-pay

## 2019-01-28 VITALS — BP 132/84 | HR 76 | Ht 71.0 in | Wt 326.8 lb

## 2019-01-28 DIAGNOSIS — R600 Localized edema: Secondary | ICD-10-CM

## 2019-01-28 DIAGNOSIS — E119 Type 2 diabetes mellitus without complications: Secondary | ICD-10-CM | POA: Insufficient documentation

## 2019-01-28 DIAGNOSIS — I10 Essential (primary) hypertension: Secondary | ICD-10-CM

## 2019-01-28 DIAGNOSIS — Z7984 Long term (current) use of oral hypoglycemic drugs: Secondary | ICD-10-CM

## 2019-01-28 DIAGNOSIS — Z87891 Personal history of nicotine dependence: Secondary | ICD-10-CM

## 2019-01-28 DIAGNOSIS — M7989 Other specified soft tissue disorders: Secondary | ICD-10-CM | POA: Insufficient documentation

## 2019-01-28 DIAGNOSIS — Z79899 Other long term (current) drug therapy: Secondary | ICD-10-CM

## 2019-01-28 DIAGNOSIS — Z6841 Body Mass Index (BMI) 40.0 and over, adult: Secondary | ICD-10-CM

## 2019-01-28 NOTE — Assessment & Plan Note (Signed)
This certainly exacerbates his lower extremity swelling.  Weight loss and activity may help his lower extremity swelling.

## 2019-01-28 NOTE — Assessment & Plan Note (Signed)
blood pressure control important in reducing the progression of atherosclerotic disease. On appropriate oral medications.  

## 2019-01-28 NOTE — Patient Instructions (Signed)

## 2019-01-28 NOTE — Assessment & Plan Note (Signed)
blood glucose control important in reducing the progression of atherosclerotic disease. Also, involved in wound healing. On appropriate medications.  

## 2019-01-28 NOTE — Assessment & Plan Note (Signed)
I have had a long discussion with the patient regarding swelling and why it  causes symptoms.  Patient will begin wearing graduated compression stockings class 1 (20-30 mmHg) on a daily basis a prescription was given. The patient will  beginning wearing the stockings first thing in the morning and removing them in the evening. The patient is instructed specifically not to sleep in the stockings.   In addition, behavioral modification will be initiated.  This will include frequent elevation, use of over the counter pain medications and exercise such as walking.  I have reviewed systemic causes for chronic edema such as liver, kidney and cardiac etiologies.  The patient denies problems with these organ systems.    Consideration for a lymph pump will also be made based upon the effectiveness of conservative therapy.  This would help to improve the edema control and prevent sequela such as ulcers and infections.  The patient almost certainly has significant lymphedema from chronic scarring and lymphatic channels after 2 previous episodes of cellulitis.  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  Patient should undergo duplex ultrasound of the venous system to ensure that DVT or reflux is not present.  The patient will follow-up with me after the ultrasound.

## 2019-01-28 NOTE — Progress Notes (Signed)
Patient ID: Mathew Porter, male   DOB: February 06, 1956, 63 y.o.   MRN: [id number]  Chief Complaint  Patient presents with  . New Patient (Initial Visit)    ref Burnett Sheng for LLE venous stasis    HPI Mathew Porter is a 63 y.o. male.  I am asked to see the patient by Dr. Burnett Sheng for evaluation of marked swelling and discoloration of the left lower extremity.  This has been a somewhat longstanding problem with acute worsening about 3 months ago.  This followed his third episode of cellulitis in the left leg.  After the first 2 episodes, some discoloration and swelling were present but not nearly as severe as it is now.  He had an episode of superficial thrombophlebitis in his right arm after an IV, but no lower extremity superficial thrombophlebitis or DVT to his knowledge.  He says he did have a mother and a sibling who had trouble with leg swelling.  Really no right leg symptoms to speak of.  Recently, he began wearing compression stockings which have helped his swelling and pain in his legs.  The discoloration remains very prominent.  No ulceration or infection.  No fever or chills.     Past Medical History:  Diagnosis Date  . Anxiety   . HTN (hypertension)   . Obesity   . Sleep apnea     Past Surgical History:  Procedure Laterality Date  . COLON SURGERY  1994   Diverticulitis  . HYDROCELE EXCISION / REPAIR  2009  . VASECTOMY  2009    Family History Family History  Problem Relation Age of Onset  . Lung cancer Father   Mother and sister with leg swelling No history of DVT or bleeding disorders  Social History Social History   Tobacco Use  . Smoking status: Former Smoker    Years: 40.00    Types: Cigarettes    Last attempt to quit: 08/21/2014    Years since quitting: 4.4  . Smokeless tobacco: Never Used  Substance Use Topics  . Alcohol use: Not Currently  . Drug use: Never    No Known Allergies  Current Outpatient Medications  Medication Sig Dispense Refill  .  metFORMIN (GLUCOPHAGE) 500 MG tablet Take 1 tablet (500 mg total) by mouth 2 (two) times daily with a meal. 60 tablet 0  . oxybutynin (DITROPAN XL) 15 MG 24 hr tablet TAKE 1 TABLET(15 MG) BY MOUTH AT BEDTIME 90 tablet 1  . simvastatin (ZOCOR) 20 MG tablet TAKE 1 TABLET BY MOUTH  NIGHTLY    . SUMAtriptan (IMITREX) 50 MG tablet Take 1 tablet (50 mg total) by mouth 3 (three) times daily as needed for migraine or headache. May repeat in 2 hours if headache persists or recurs. 20 tablet 0  . tamsulosin (FLOMAX) 0.4 MG CAPS capsule Take by mouth.    . verapamil (CALAN-SR) 240 MG CR tablet TAKE 1 TABLET BY MOUTH ONCE DAILY    . aspirin-acetaminophen-caffeine (EXCEDRIN MIGRAINE) 250-250-65 MG tablet Take by mouth.    . pantoprazole (PROTONIX) 40 MG tablet Take 1 tablet (40 mg total) by mouth daily. (Patient not taking: Reported on 01/28/2019) 30 tablet 0   No current facility-administered medications for this visit.       REVIEW OF SYSTEMS (Negative unless checked)  Constitutional: Weight loss  Fever  Chills Cardiac: Chest pain   Chest pressure   Palpitations   Shortness of breath when laying flat   Shortness of breath at rest     Shortness of breath with exertion. Vascular:  [x] Pain in legs with walking   [x] Pain in legs at rest   [] Pain in legs when laying flat   [] Claudication   [] Pain in feet when walking  [] Pain in feet at rest  [] Pain in feet when laying flat   [] History of DVT   [x] Phlebitis   [x] Swelling in legs   [] Varicose veins   [] Non-healing ulcers Pulmonary:   [] Uses home oxygen   [] Productive cough   [] Hemoptysis   [] Wheeze  [] COPD   [] Asthma Neurologic:  [] Dizziness  [] Blackouts   [] Seizures   [] History of stroke   [] History of TIA  [] Aphasia   [] Temporary blindness   [] Dysphagia   [] Weakness or numbness in arms   [] Weakness or numbness in legs Musculoskeletal:  [x] Arthritis   [] Joint swelling   [] Joint pain   [] Low back pain Hematologic:  [] Easy bruising  [] Easy bleeding    [] Hypercoagulable state   [] Anemic  [] Hepatitis Gastrointestinal:  [] Blood in stool   [] Vomiting blood  [] Gastroesophageal reflux/heartburn   [] Abdominal pain Genitourinary:  [] Chronic kidney disease   [] Difficult urination  [] Frequent urination  [] Burning with urination   [] Hematuria Skin:  [] Rashes   [] Ulcers   [] Wounds Psychological:  [x] History of anxiety   []  History of major depression.    Physical Exam BP 132/84 (BP Location: Right Arm)   Pulse 76   Ht 5\' 11"  (1.803 m)   Wt (!) 326 lb 12.8 oz (148.2 kg)   BMI 45.58 kg/m  Gen:  WD/WN, NAD. Obese  Head: [place]/AT, No temporalis wasting.  Ear/Nose/Throat: Hearing grossly intact, nares w/o erythema or drainage, oropharynx w/o Erythema/Exudate Eyes: Conjunctiva clear, sclera non-icteric  Neck: trachea midline.  No JVD.  Pulmonary:  Good air movement, respirations not labored, no use of accessory muscles  Cardiac: RRR, no JVD Vascular:  Vessel Right Left  Radial Palpable Palpable                          DP 2+ 1+  PT 1+ NP   Gastrointestinal:. No masses, surgical incisions, or scars. Musculoskeletal: M/S 5/5 throughout.  Extremities without ischemic changes.  No deformity or atrophy. Trace RLE edema, 2+ LLE edema.  Moderate to severe venous stasis of the left leg from just below the knee down to the foot and ankle Neurologic: Sensation grossly intact in extremities.  Symmetrical.  Speech is fluent. Motor exam as listed above. Psychiatric: Judgment intact, Mood & affect appropriate for pt's clinical situation. Dermatologic: No rashes or ulcers noted.  No cellulitis or open wounds.    Radiology No results found.  Labs Recent Results (from the past 2160 hour(s))  Basic metabolic panel     Status: Abnormal   Collection Time: 11/05/18 11:37 AM  Result Value Ref Range   Sodium 135 135 - 145 mmol/L   Potassium 3.7 3.5 - 5.1 mmol/L   Chloride 100 98 - 111 mmol/L   CO2 22 22 - 32 mmol/L   Glucose, Bld 184 (H) 70 - 99 mg/dL    BUN 15 8 - 23 mg/dL   Creatinine, Ser 1.61 0.61 - 1.24 mg/dL   Calcium 8.9 8.9 - 09.6 mg/dL   GFR calc non Af Amer >60 >60 mL/min   GFR calc Af Amer >60 >60 mL/min   Anion gap 13 5 - 15    Comment: Performed at [facility], [address], [place], [place] [address]  CBC with Differential  Status: Abnormal   Collection Time: 11/05/18 11:37 AM  Result Value Ref Range   WBC 18.8 (H) 4.0 - 10.5 K/uL   RBC 5.10 4.22 - 5.81 MIL/uL   Hemoglobin 14.9 13.0 - 17.0 g/dL   HCT 43.3 29.5 - 18.8 %   MCV 88.0 80.0 - 100.0 fL   MCH 29.2 26.0 - 34.0 pg   MCHC 33.2 30.0 - 36.0 g/dL   RDW 41.6 60.6 - 30.1 %   Platelets 232 150 - 400 K/uL   nRBC 0.0 0.0 - 0.2 %   Neutrophils Relative % 90 %   Neutro Abs 16.9 (H) 1.7 - 7.7 K/uL   Lymphocytes Relative 6 %   Lymphs Abs 1.1 0.7 - 4.0 K/uL   Monocytes Relative 3 %   Monocytes Absolute 0.6 0.1 - 1.0 K/uL   Eosinophils Relative 0 %   Eosinophils Absolute 0.0 0.0 - 0.5 K/uL   Basophils Relative 0 %   Basophils Absolute 0.0 0.0 - 0.1 K/uL   Immature Granulocytes 1 %   Abs Immature Granulocytes 0.17 (H) 0.00 - 0.07 K/uL    Comment: Performed at [facility], [address] [place], [place] [address]  Troponin I - Once     Status: None   Collection Time: 11/05/18 11:37 AM  Result Value Ref Range   Troponin I <0.03 <0.03 ng/mL    Comment: Performed at [facility], [address] [place], [place] [address]  Fibrin derivatives D-Dimer     Status: Abnormal   Collection Time: 11/05/18 11:37 AM  Result Value Ref Range   Fibrin derivatives D-dimer ([facility]) 832.83 (H) 0.00 - 499.00 ng/mL (FEU)    Comment: (NOTE) <> Exclusion of Venous Thromboembolism (VTE) - OUTPATIENT ONLY   (Emergency Department or [place])   0-499 ng/ml (FEU): With a low to intermediate pretest probability                      for VTE this test result excludes the diagnosis                      of VTE.   >499 ng/ml (FEU) : VTE not excluded; additional  work up for VTE is                      required. <> Testing on Inpatients and Evaluation of Disseminated Intravascular   Coagulation (DIC) Reference Range:   0-499 ng/ml (FEU) Performed at [facility], [address] [place], [place] [address]   Protime-INR     Status: None   Collection Time: 11/05/18 11:37 AM  Result Value Ref Range   Prothrombin Time 13.6 11.4 - 15.2 seconds   INR 1.1 0.8 - 1.2    Comment: (NOTE) INR goal varies based on device and disease states. Performed at [facility], [address] [place], [place] [address]   Influenza panel by PCR (type A & B)     Status: None   Collection Time: 11/05/18 11:45 AM  Result Value Ref Range   Influenza A By PCR NEGATIVE NEGATIVE   Influenza B By PCR NEGATIVE NEGATIVE    Comment: (NOTE) The Xpert Xpress Flu assay is intended as an aid in the diagnosis of  influenza and should not be used as a sole basis for treatment.  This  assay is FDA approved for nasopharyngeal swab specimens only. Nasal  washings and aspirates are unacceptable for Xpert  Xpress Flu testing. Performed at [facility], [address] [place], [place] [address]   Culture, blood (routine x 2)     Status: None   Collection Time: 11/05/18  3:03 PM  Result Value Ref Range   Specimen Description BLOOD LEFT ANTECUBITAL    Special Requests      BOTTLES DRAWN AEROBIC AND ANAEROBIC Blood Culture results may not be optimal due to an excessive volume of blood received in culture bottles   Culture      NO GROWTH 5 DAYS Performed at [facility], [address] [place], [place] [address]    Report Status 11/10/2018 FINAL   Lactic acid, plasma     Status: Abnormal   Collection Time: 11/05/18  3:03 PM  Result Value Ref Range   Lactic Acid, Venous 2.0 (HH) 0.5 - 1.9 mmol/L    Comment: CRITICAL RESULT CALLED TO, READ BACK BY AND VERIFIED WITH TOM NEGY 11/05/18 1539 KLW Performed at [facility], [address] [place], [place] [address]   Culture, blood (routine x 2)     Status: None   Collection Time: 11/05/18  6:11 PM  Result Value Ref Range   Specimen Description BLOOD BLOOD LEFT HAND    Special Requests      BOTTLES DRAWN AEROBIC AND ANAEROBIC Blood Culture results may not be optimal due to an inadequate volume of blood received in culture bottles   Culture      NO GROWTH 5 DAYS Performed at [facility], [address] [place], [place] [address]    Report Status 11/10/2018 FINAL   Lactic acid, plasma     Status: Abnormal   Collection Time: 11/05/18  6:19 PM  Result Value Ref Range   Lactic Acid, Venous 2.1 (HH) 0.5 - 1.9 mmol/L    Comment: CRITICAL RESULT CALLED TO, READ BACK BY AND VERIFIED WITH Tarkio AT 2956 11/05/2018.  TFK Performed at [facility], [address] [place], [place] [address]   HIV antibody (Routine Testing)     Status: None   Collection Time: 11/05/18  6:19 PM  Result Value Ref Range   HIV Screen 4th Generation wRfx Non Reactive Non Reactive    Comment: (NOTE) Performed At: [facility] [address] [place], [place] [id number] Jolene Schimke MD XB:[id number]   Basic metabolic panel     Status: Abnormal   Collection Time: 11/06/18  5:10 AM  Result Value Ref Range   Sodium 134 (L) 135 - 145 mmol/L   Potassium 3.4 (L) 3.5 - 5.1 mmol/L   Chloride 101 98 - 111 mmol/L   CO2 21 (L) 22 - 32 mmol/L   Glucose, Bld 226 (H) 70 - 99 mg/dL   BUN 13 8 - 23 mg/dL   Creatinine, Ser 0.27 0.61 - 1.24 mg/dL   Calcium 8.0 (L) 8.9 - 10.3 mg/dL   GFR calc non Af Amer >60 >60 mL/min   GFR calc Af Amer >60 >60 mL/min   Anion gap 12 5 - 15    Comment: Performed at [facility], [address] [place], [place] [address]  CBC     Status: Abnormal   Collection Time: 11/06/18  5:10 AM  Result Value Ref Range   WBC 19.0 (H) 4.0 - 10.5 K/uL   RBC 4.54 4.22 - 5.81 MIL/uL   Hemoglobin 13.1 13.0 - 17.0 g/dL   HCT 44.0 34.7 -  42.5 %   MCV 87.7 80.0 -  100.0 fL   MCH 28.9 26.0 - 34.0 pg   MCHC 32.9 30.0 - 36.0 g/dL   RDW 14.9 11.5 - 15.5 %   Platelets 212 150 - 400 K/uL   nRBC 0.0 0.0 - 0.2 %    Comment: Performed at [facility], [address] [place], [place] [address]  CBC     Status: Abnormal   Collection Time: 11/07/18  4:17 AM  Result Value Ref Range   WBC 21.1 (H) 4.0 - 10.5 K/uL   RBC 4.34 4.22 - 5.81 MIL/uL   Hemoglobin 12.3 (L) 13.0 - 17.0 g/dL   HCT 37.5 (L) 39.0 - 52.0 %   MCV 86.4 80.0 - 100.0 fL   MCH 28.3 26.0 - 34.0 pg   MCHC 32.8 30.0 - 36.0 g/dL   RDW 15.0 11.5 - 15.5 %   Platelets 233 150 - 400 K/uL   nRBC 0.0 0.0 - 0.2 %    Comment: Performed at [facility], [address] [place], [place] [address]  Basic metabolic panel     Status: Abnormal   Collection Time: 11/07/18  4:17 AM  Result Value Ref Range   Sodium 133 (L) 135 - 145 mmol/L   Potassium 3.8 3.5 - 5.1 mmol/L   Chloride 101 98 - 111 mmol/L   CO2 23 22 - 32 mmol/L   Glucose, Bld 253 (H) 70 - 99 mg/dL   BUN 17 8 - 23 mg/dL   Creatinine, Ser 0.95 0.61 - 1.24 mg/dL   Calcium 7.8 (L) 8.9 - 10.3 mg/dL   GFR calc non Af Amer >60 >60 mL/min   GFR calc Af Amer >60 >60 mL/min   Anion gap 9 5 - 15    Comment: Performed at [facility], [address] [place], [place] [address]  Hemoglobin A1c     Status: Abnormal   Collection Time: 11/07/18  4:17 AM  Result Value Ref Range   Hgb A1c MFr Bld 7.1 (H) 4.8 - 5.6 %    Comment: (NOTE) Pre diabetes:          5.7%-6.4% Diabetes:              >6.4% Glycemic control for   <7.0% adults with diabetes    Mean Plasma Glucose 157.07 mg/dL    Comment: Performed at [facility], [place] [address] [place],  [address]  Procalcitonin - Baseline     Status: None   Collection Time: 11/07/18  4:17 AM  Result Value Ref Range   Procalcitonin 1.71 ng/mL    Comment:        Interpretation: PCT > 0.5 ng/mL and <= 2 ng/mL: Systemic infection (sepsis)  is possible, but other conditions are known to elevate PCT as well. (NOTE)       Sepsis PCT Algorithm           Lower Respiratory Tract                                      Infection PCT Algorithm    ----------------------------     ----------------------------         PCT < 0.25 ng/mL                PCT < 0.10 ng/mL         Strongly encourage  Strongly discourage   discontinuation of antibiotics    initiation of antibiotics    ----------------------------     -----------------------------       PCT 0.25 - 0.50 ng/mL            PCT 0.10 - 0.25 ng/mL               OR       >80% decrease in PCT            Discourage initiation of                                            antibiotics      Encourage discontinuation           of antibiotics    ----------------------------     -----------------------------         PCT >= 0.50 ng/mL              PCT 0.26 - 0.50 ng/mL                AND       <80% decrease in PCT             Encourage initiation of                                             antibiotics       Encourage continuation           of antibiotics    ----------------------------     -----------------------------        PCT >= 0.50 ng/mL                  PCT > 0.50 ng/mL               AND         increase in PCT                  Strongly encourage                                      initiation of antibiotics    Strongly encourage escalation           of antibiotics                                     -----------------------------                                           PCT <= 0.25 ng/mL                                                 OR                                        >  80% decrease in PCT                                     Discontinue / Do not initiate                                             antibiotics Performed at [facility], [address] [place], [place] [address]   Glucose, capillary     Status: Abnormal   Collection Time:  11/07/18  8:11 AM  Result Value Ref Range   Glucose-Capillary 209 (H) 70 - 99 mg/dL  Glucose, capillary     Status: Abnormal   Collection Time: 11/07/18 12:05 PM  Result Value Ref Range   Glucose-Capillary 257 (H) 70 - 99 mg/dL  Glucose, capillary     Status: Abnormal   Collection Time: 11/07/18  4:49 PM  Result Value Ref Range   Glucose-Capillary 219 (H) 70 - 99 mg/dL  MRSA PCR Screening     Status: None   Collection Time: 11/07/18  5:39 PM  Result Value Ref Range   MRSA by PCR NEGATIVE NEGATIVE    Comment:        The GeneXpert MRSA Assay (FDA approved for NASAL specimens only), is one component of a comprehensive MRSA colonization surveillance program. It is not intended to diagnose MRSA infection nor to guide or monitor treatment for MRSA infections. Performed at [facility], [address] [place], [place] [address]   Glucose, capillary     Status: Abnormal   Collection Time: 11/07/18  9:27 PM  Result Value Ref Range   Glucose-Capillary 165 (H) 70 - 99 mg/dL  Procalcitonin     Status: None   Collection Time: 11/08/18  5:05 AM  Result Value Ref Range   Procalcitonin 1.20 ng/mL    Comment:        Interpretation: PCT > 0.5 ng/mL and <= 2 ng/mL: Systemic infection (sepsis) is possible, but other conditions are known to elevate PCT as well. (NOTE)       Sepsis PCT Algorithm           Lower Respiratory Tract                                      Infection PCT Algorithm    ----------------------------     ----------------------------         PCT < 0.25 ng/mL                PCT < 0.10 ng/mL         Strongly encourage             Strongly discourage   discontinuation of antibiotics    initiation of antibiotics    ----------------------------     -----------------------------       PCT 0.25 - 0.50 ng/mL            PCT 0.10 - 0.25 ng/mL               OR       >80% decrease in PCT            Discourage initiation of  antibiotics      Encourage discontinuation           of antibiotics    ----------------------------     -----------------------------         PCT >= 0.50 ng/mL              PCT 0.26 - 0.50 ng/mL                AND       <80% decrease in PCT             Encourage initiation of                                             antibiotics       Encourage continuation           of antibiotics    ----------------------------     -----------------------------        PCT >= 0.50 ng/mL                  PCT > 0.50 ng/mL               AND         increase in PCT                  Strongly encourage                                      initiation of antibiotics    Strongly encourage escalation           of antibiotics                                     -----------------------------                                           PCT <= 0.25 ng/mL                                                 OR                                        > 80% decrease in PCT                                     Discontinue / Do not initiate                                             antibiotics Performed at [facility], [address] [place], [place] [address]   CBC     Status: Abnormal   Collection Time: 11/08/18  5:05 AM  Result Value Ref Range   WBC 16.5 (H) 4.0 - 10.5 K/uL   RBC 4.56 4.22 - 5.81 MIL/uL   Hemoglobin 13.3 13.0 - 17.0 g/dL   HCT 16.140.1 09.639.0 - 04.552.0 %   MCV 87.9 80.0 - 100.0 fL   MCH 29.2 26.0 - 34.0 pg   MCHC 33.2 30.0 - 36.0 g/dL   RDW 40.915.9 (H) 81.111.5 - 91.415.5 %   Platelets 256 150 - 400 K/uL   nRBC 0.0 0.0 - 0.2 %    Comment: Performed at [facility], [address] [place], [place] [address]  Basic metabolic panel     Status: Abnormal   Collection Time: 11/08/18  5:05 AM  Result Value Ref Range   Sodium 134 (L) 135 - 145 mmol/L   Potassium 4.9 3.5 - 5.1 mmol/L    Comment: HEMOLYSIS AT THIS LEVEL MAY AFFECT RESULT   Chloride 102 98 - 111 mmol/L   CO2 24 22 - 32 mmol/L    Glucose, Bld 171 (H) 70 - 99 mg/dL   BUN 16 8 - 23 mg/dL   Creatinine, Ser 6.210.79 0.61 - 1.24 mg/dL   Calcium 7.8 (L) 8.9 - 10.3 mg/dL   GFR calc non Af Amer >60 >60 mL/min   GFR calc Af Amer >60 >60 mL/min   Anion gap 8 5 - 15    Comment: Performed at [facility], [address] [place], [place] [address]  Glucose, capillary     Status: Abnormal   Collection Time: 11/08/18  8:01 AM  Result Value Ref Range   Glucose-Capillary 155 (H) 70 - 99 mg/dL  Glucose, capillary     Status: Abnormal   Collection Time: 11/08/18 11:38 AM  Result Value Ref Range   Glucose-Capillary 145 (H) 70 - 99 mg/dL  Glucose, capillary     Status: Abnormal   Collection Time: 11/08/18  4:38 PM  Result Value Ref Range   Glucose-Capillary 155 (H) 70 - 99 mg/dL    Assessment/Plan:  Hypertension blood pressure control important in reducing the progression of atherosclerotic disease. On appropriate oral medications.   Morbid obesity with BMI of 40.0-44.9, adult (HCC) This certainly exacerbates his lower extremity swelling.  Weight loss and activity may help his lower extremity swelling.  Diabetes (HCC) blood glucose control important in reducing the progression of atherosclerotic disease. Also, involved in wound healing. On appropriate medications.   Swelling of limb I have had a long discussion with the patient regarding swelling and why it  causes symptoms.  Patient will begin wearing graduated compression stockings class 1 (20-30 mmHg) on a daily basis a prescription was given. The patient will  beginning wearing the stockings first thing in the morning and removing them in the evening. The patient is instructed specifically not to sleep in the stockings.   In addition, behavioral modification will be initiated.  This will include frequent elevation, use of over the counter pain medications and exercise such as walking.  I have reviewed systemic causes for chronic edema such as liver, kidney and  cardiac etiologies.  The patient denies problems with these organ systems.    Consideration for a lymph pump will also be made based upon the effectiveness of conservative therapy.  This would help to improve the edema control and prevent sequela such as ulcers and infections.  The patient almost certainly has significant lymphedema from chronic scarring and lymphatic channels after 2 previous episodes of cellulitis.  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  Patient should undergo duplex ultrasound of the venous system to ensure that DVT or reflux is not present.  The patient will follow-up with me after the ultrasound.        Mathew Porter 01/28/2019, 3:23 PM   This note was created with Dragon medical transcription system.  Any errors from dictation are unintentional.

## 2019-01-30 ENCOUNTER — Ambulatory Visit (INDEPENDENT_AMBULATORY_CARE_PROVIDER_SITE_OTHER): Payer: Managed Care, Other (non HMO)

## 2019-01-30 ENCOUNTER — Other Ambulatory Visit: Payer: Self-pay

## 2019-01-30 ENCOUNTER — Ambulatory Visit (INDEPENDENT_AMBULATORY_CARE_PROVIDER_SITE_OTHER): Payer: Managed Care, Other (non HMO) | Admitting: Nurse Practitioner

## 2019-01-30 ENCOUNTER — Encounter (INDEPENDENT_AMBULATORY_CARE_PROVIDER_SITE_OTHER): Payer: Self-pay | Admitting: Nurse Practitioner

## 2019-01-30 VITALS — BP 126/83 | HR 81 | Resp 12 | Ht 71.0 in | Wt 329.0 lb

## 2019-01-30 DIAGNOSIS — Z7984 Long term (current) use of oral hypoglycemic drugs: Secondary | ICD-10-CM

## 2019-01-30 DIAGNOSIS — Z87891 Personal history of nicotine dependence: Secondary | ICD-10-CM | POA: Diagnosis not present

## 2019-01-30 DIAGNOSIS — I890 Lymphedema, not elsewhere classified: Secondary | ICD-10-CM | POA: Diagnosis not present

## 2019-01-30 DIAGNOSIS — I10 Essential (primary) hypertension: Secondary | ICD-10-CM

## 2019-01-30 DIAGNOSIS — M7989 Other specified soft tissue disorders: Secondary | ICD-10-CM

## 2019-01-30 DIAGNOSIS — E119 Type 2 diabetes mellitus without complications: Secondary | ICD-10-CM

## 2019-01-30 DIAGNOSIS — R600 Localized edema: Secondary | ICD-10-CM

## 2019-01-30 DIAGNOSIS — Z79899 Other long term (current) drug therapy: Secondary | ICD-10-CM

## 2019-01-31 ENCOUNTER — Encounter (INDEPENDENT_AMBULATORY_CARE_PROVIDER_SITE_OTHER): Payer: Self-pay | Admitting: Nurse Practitioner

## 2019-01-31 DIAGNOSIS — I890 Lymphedema, not elsewhere classified: Secondary | ICD-10-CM | POA: Insufficient documentation

## 2019-01-31 NOTE — Progress Notes (Signed)
SUBJECTIVE:  Patient ID: Mathew Porter, male    DOB: Oct 08, 1955, 63 y.o.   MRN: [id number] Chief Complaint  Patient presents with  . Follow-up    HPI  Mathew Porter is a 63 y.o. male The patient returns to the office for followup evaluation regarding leg swelling.  The swelling has persisted and the pain associated with swelling continues. There have not been any interval development of a ulcerations or wounds.  Since the previous visit the patient has been wearing graduated compression stockings and has noted little if any improvement in the lymphedema. The patient has been using compression routinely morning until night.  The patient has consistently been wearing medical grade 1 compression stockings since his diagnosis of lymphedema on 11/05/2018.  The patient also states elevation during the day and exercise is being done too.  Today the patient underwent venous reflux study of his left lower extremity which reveals reflux at the saphenofemoral junction of the great saphenous vein.  No evidence of DVT or superficial venous thrombosis bilaterally.  Past Medical History:  Diagnosis Date  . Anxiety   . Cellulitis   . HTN (hypertension)   . Obesity   . Sleep apnea     Past Surgical History:  Procedure Laterality Date  . COLON SURGERY  1994   Diverticulitis  . HYDROCELE EXCISION / REPAIR  2009  . VASECTOMY  2009    Social History   Socioeconomic History  . Marital status: Married    Spouse name: Not on file  . Number of children: Not on file  . Years of education: Not on file  . Highest education level: Not on file  Occupational History  . Not on file  Social Needs  . Financial resource strain: Not on file  . Food insecurity    Worry: Not on file    Inability: Not on file  . Transportation needs    Medical: Not on file    Non-medical: Not on file  Tobacco Use  . Smoking status: Former Smoker    Years: 40.00    Types: Cigarettes    Quit date: 08/21/2014     Years since quitting: 4.4  . Smokeless tobacco: Never Used  Substance and Sexual Activity  . Alcohol use: Not Currently  . Drug use: Never  . Sexual activity: Yes  Lifestyle  . Physical activity    Days per week: Not on file    Minutes per session: Not on file  . Stress: Not on file  Relationships  . Social Herbalist on phone: Not on file    Gets together: Not on file    Attends religious service: Not on file    Active member of club or organization: Not on file    Attends meetings of clubs or organizations: Not on file    Relationship status: Not on file  . Intimate partner violence    Fear of current or ex partner: Not on file    Emotionally abused: Not on file    Physically abused: Not on file    Forced sexual activity: Not on file  Other Topics Concern  . Not on file  Social History Narrative  . Not on file    Family History  Problem Relation Age of Onset  . Lung cancer Father     No Known Allergies   Review of Systems   Review of Systems: Negative Unless Checked Constitutional: [] Weight loss  [] Fever  [] Chills Cardiac: []   Chest pain   []  Atrial Fibrillation  [] Palpitations   [] Shortness of breath when laying flat   [] Shortness of breath with exertion. [] Shortness of breath at rest Vascular:  [] Pain in legs with walking   [] Pain in legs with standing [] Pain in legs when laying flat   [] Claudication    [] Pain in feet when laying flat    [] History of DVT   [] Phlebitis   [x] Swelling in legs   [x] Varicose veins   [] Non-healing ulcers Pulmonary:   [] Uses home oxygen   [] Productive cough   [] Hemoptysis   [] Wheeze  [] COPD   [] Asthma Neurologic:  [] Dizziness   [] Seizures  [] Blackouts [] History of stroke   [] History of TIA  [] Aphasia   [] Temporary Blindness   [] Weakness or numbness in arm   [] Weakness or numbness in leg Musculoskeletal:   [] Joint swelling   [] Joint pain   [] Low back pain  []  History of Knee Replacement [] Arthritis [] back Surgeries  []  Spinal  Stenosis    Hematologic:  [] Easy bruising  [] Easy bleeding   [] Hypercoagulable state   [] Anemic Gastrointestinal:  [] Diarrhea   [] Vomiting  [] Gastroesophageal reflux/heartburn   [] Difficulty swallowing. [] Abdominal pain Genitourinary:  [] Chronic kidney disease   [] Difficult urination  [] Anuric   [] Blood in urine [] Frequent urination  [] Burning with urination   [] Hematuria Skin:  [x] Rashes   [] Ulcers [] Wounds Psychological:  [x] History of anxiety   []  History of major depression  []  Memory Difficulties      OBJECTIVE:   Physical Exam  BP 126/83 (BP Location: Left Arm, Patient Position: Sitting, Cuff Size: Large)   Pulse 81   Resp 12   Ht 5\' 11"  (1.803 m)   Wt (!) 329 lb (149.2 kg)   BMI 45.89 kg/m   Gen: WD/WN, NAD Head: [place]/AT, No temporalis wasting.  Ear/Nose/Throat: Hearing grossly intact, nares w/o erythema or drainage Eyes: PER, EOMI, sclera nonicteric.  Neck: Supple, no masses.  No JVD.  Pulmonary:  Good air movement, no use of accessory muscles.  Cardiac: RRR Vascular: 3+ edema left lower extremity  Vessel Right Left  Radial Palpable Palpable  Dorsalis Pedis Palpable Palpable  Posterior Tibial Palpable Palpable   Gastrointestinal: soft, non-distended. No guarding/no peritoneal signs.  Musculoskeletal: M/S 5/5 throughout.  No deformity or atrophy.  Neurologic: Pain and light touch intact in extremities.  Symmetrical.  Speech is fluent. Motor exam as listed above. Psychiatric: Judgment intact, Mood & affect appropriate for pt's clinical situation. Dermatologic: Extensive venous stasis changes LLE.  No Ulcers Noted.  No changes consistent with cellulitis. Lymph : No Cervical lymphadenopathy, Dermal thickening LLE        ASSESSMENT AND PLAN:  1. Lymphedema Recommend:  No surgery or intervention at this point in time.    I have reviewed my previous discussion with the patient regarding swelling and why it causes symptoms.  Patient will continue wearing graduated  compression stockings class 1 (20-30 mmHg) on a daily basis. The patient will  beginning wearing the stockings first thing in the morning and removing them in the evening. The patient is instructed specifically not to sleep in the stockings.    In addition, behavioral modification including several periods of elevation of the lower extremities during the day will be continued.  This was reviewed with the patient during the initial visit.  The patient will also continue routine exercise, especially walking on a daily basis as was discussed during the initial visit.    Despite conservative treatments including graduated compression therapy class 1  and behavioral modification including exercise and elevation the patient  has not obtained adequate control of the lymphedema.  The patient still has stage 3 lymphedema and therefore, I believe that a lymph pump should be added to improve the control of the patient's lymphedema.  Additionally, a lymph pump is warranted because it will reduce the risk of cellulitis and ulceration in the future.  Patient should follow-up in six months    2. Essential hypertension Continue antihypertensive medications as already ordered, these medications have been reviewed and there are no changes at this time.   3. Type 2 diabetes mellitus without complication, without long-term current use of insulin (HCC) Continue hypoglycemic medications as already ordered, these medications have been reviewed and there are no changes at this time.  Hgb A1C to be monitored as already arranged by primary service    Current Outpatient Medications on File Prior to Visit  Medication Sig Dispense Refill  . aspirin-acetaminophen-caffeine (EXCEDRIN MIGRAINE) 250-250-65 MG tablet Take by mouth.    . metFORMIN (GLUCOPHAGE) 500 MG tablet Take 1 tablet (500 mg total) by mouth 2 (two) times daily with a meal. (Patient taking differently: Take 1,000 mg by mouth daily with breakfast. ) 60 tablet  0  . oxybutynin (DITROPAN XL) 15 MG 24 hr tablet TAKE 1 TABLET(15 MG) BY MOUTH AT BEDTIME 90 tablet 1  . simvastatin (ZOCOR) 20 MG tablet TAKE 1 TABLET BY MOUTH  NIGHTLY    . SUMAtriptan (IMITREX) 50 MG tablet Take 1 tablet (50 mg total) by mouth 3 (three) times daily as needed for migraine or headache. May repeat in 2 hours if headache persists or recurs. 20 tablet 0  . verapamil (CALAN-SR) 240 MG CR tablet TAKE 1 TABLET BY MOUTH ONCE DAILY    . pantoprazole (PROTONIX) 40 MG tablet Take 1 tablet (40 mg total) by mouth daily. (Patient not taking: Reported on 01/28/2019) 30 tablet 0  . tamsulosin (FLOMAX) 0.4 MG CAPS capsule Take by mouth.     No current facility-administered medications on file prior to visit.     There are no Patient Instructions on file for this visit. No follow-ups on file.   Georgiana SpinnerFallon E Caidon Foti, NP  This note was completed with Office managerDragon Dictation.  Any errors are purely unintentional.

## 2019-02-04 ENCOUNTER — Ambulatory Visit: Payer: Managed Care, Other (non HMO) | Admitting: Urology

## 2019-02-19 NOTE — Progress Notes (Signed)
02/20/2019 3:06 PM   Mathew CleverlyWilliam A Ressler January 26, 1956 [id number]  Referring provider: Jerl MinaHedrick, James, MD [address] [facility] [place] [id number]  CC: Urinary frequency  HPI: Patient is a 63 year old Caucasian male with BPH with LUTS, nocturia and constipation who presents today for follow up.    BPH WITH LUTS  (prostate and/or bladder) IPSS score:  2/2    PVR: 16 mL   Previous score: 7/4    Previous PVR: 0 mL  Major complaint(s): Frequency x  several months.  Denies any dysuria, hematuria or suprapubic pain.   Currently taking: He states he is no longer taking the oxybutynin or the tamsulosin.  He has been diagnosed with DM and has found that since he has started to treat his diabetes, his urinary symptoms have improved. Denies any recent fevers, chills, nausea or vomiting.  He is drinking 3 to 4 quarts of water daily.  He has an occasional Coke once or twice a week.  No coffee.  No tea.  Glass of milk in the am.  No alcohol.  No juices.  No energy drinks.  Fluid intake is stable.   IPSS    Row Name 02/20/19 1300         International Prostate Symptom Score   How often have you had the sensation of not emptying your bladder?  Not at All     How often have you had to urinate less than every two hours?  Less than 1 in 5 times     How often have you found you stopped and started again several times when you urinated?  Not at All     How often have you found it difficult to postpone urination?  Not at All     How often have you had a weak urinary stream?  Not at All     How often have you had to strain to start urination?  Not at All     How many times did you typically get up at night to urinate?  1 Time     Total IPSS Score  2       Quality of Life due to urinary symptoms   If you were to spend the rest of your life with your urinary condition just the way it is now how would you feel about that?  Mostly Satisfied        Score:  1-7 Mild 8-19 Moderate  20-35 Severe  Nocturia Resolved   Constipation Resolved  PMH: Past Medical History:  Diagnosis Date  . Anxiety   . Cellulitis   . HTN (hypertension)   . Obesity   . Sleep apnea     Surgical History: Past Surgical History:  Procedure Laterality Date  . COLON SURGERY  1994   Diverticulitis  . HYDROCELE EXCISION / REPAIR  2009  . VASECTOMY  2009    Allergies: No Known Allergies  Family History: Family History  Problem Relation Age of Onset  . Lung cancer Father     Social History:  reports that he quit smoking about 4 years ago. His smoking use included cigarettes. He quit after 40.00 years of use. He has never used smokeless tobacco. He reports previous alcohol use. He reports that he does not use drugs.  ROS: Please see flowsheet from today's date for complete review of systems.  Physical Exam: BP 125/80   Pulse 90   Wt (!) 324 lb (147 kg)  BMI 45.19 kg/m   Constitutional:  Well nourished. Alert and oriented, No acute distress. HEENT: [place] AT, moist mucus membranes.  Trachea midline, no masses. Cardiovascular: No clubbing, cyanosis, or edema. Respiratory: Normal respiratory effort, no increased work of breathing. Neurologic: Grossly intact, no focal deficits, moving all 4 extremities. Psychiatric: Normal mood and affect.   Pertinent Imaging: Results for GERALDINE, TESAR (MRN [id number]) as of 02/27/2019 15:05  Ref. Range 02/20/2019 13:48  Scan Result Unknown 16   Assessment & Plan:    1. BPH with LUTS IPSS score is 2/2, it is improving Continue conservative management, avoiding bladder irritants and timed voiding's Most bothersome symptoms is/are frequency - getting better with DM treatment  RTC in one year for I PSS and exam   2. Nocturia Resolved  3. Constipation Resolved    Zara Council, PA-C   [place] [address] [place], [place] [address] [phone]

## 2019-02-20 ENCOUNTER — Other Ambulatory Visit: Payer: Self-pay

## 2019-02-20 ENCOUNTER — Ambulatory Visit: Payer: Managed Care, Other (non HMO) | Admitting: Urology

## 2019-02-20 ENCOUNTER — Encounter: Payer: Self-pay | Admitting: Urology

## 2019-02-20 VITALS — BP 125/80 | HR 90 | Wt 324.0 lb

## 2019-02-20 DIAGNOSIS — N401 Enlarged prostate with lower urinary tract symptoms: Secondary | ICD-10-CM | POA: Diagnosis not present

## 2019-02-20 DIAGNOSIS — K5909 Other constipation: Secondary | ICD-10-CM

## 2019-02-20 DIAGNOSIS — N138 Other obstructive and reflux uropathy: Secondary | ICD-10-CM | POA: Diagnosis not present

## 2019-02-20 DIAGNOSIS — R351 Nocturia: Secondary | ICD-10-CM | POA: Diagnosis not present

## 2019-02-20 LAB — BLADDER SCAN AMB NON-IMAGING: Scan Result: 16

## 2019-08-05 ENCOUNTER — Ambulatory Visit (INDEPENDENT_AMBULATORY_CARE_PROVIDER_SITE_OTHER): Payer: Managed Care, Other (non HMO) | Admitting: Vascular Surgery

## 2019-08-05 ENCOUNTER — Encounter (INDEPENDENT_AMBULATORY_CARE_PROVIDER_SITE_OTHER): Payer: Self-pay | Admitting: Vascular Surgery

## 2019-08-05 ENCOUNTER — Other Ambulatory Visit: Payer: Self-pay

## 2019-08-05 VITALS — BP 136/86 | HR 98 | Resp 16 | Wt 320.0 lb

## 2019-08-05 DIAGNOSIS — Z6841 Body Mass Index (BMI) 40.0 and over, adult: Secondary | ICD-10-CM

## 2019-08-05 DIAGNOSIS — I890 Lymphedema, not elsewhere classified: Secondary | ICD-10-CM | POA: Diagnosis not present

## 2019-08-05 DIAGNOSIS — I10 Essential (primary) hypertension: Secondary | ICD-10-CM | POA: Diagnosis not present

## 2019-08-05 DIAGNOSIS — E119 Type 2 diabetes mellitus without complications: Secondary | ICD-10-CM | POA: Diagnosis not present

## 2019-08-05 NOTE — Progress Notes (Signed)
MRN : [id number]  Mathew Porter is a 63 y.o. (1955/10/15) male who presents with chief complaint of  Chief Complaint  Patient presents with  . Follow-up    37month follow up  .  History of Present Illness: Patient returns today in follow up of his leg swelling and lymphedema.  He is doing well today.  He has been diligently wearing his compression stockings, elevating his legs, and using the lymphedema pump.  This is resulted in near complete resolution of his lower extremity swelling.  He is very pleased.  His legs do not bother him much at this point.  Current Outpatient Medications  Medication Sig Dispense Refill  . aspirin-acetaminophen-caffeine (EXCEDRIN MIGRAINE) 250-250-65 MG tablet Take by mouth.    . metFORMIN (GLUCOPHAGE) 500 MG tablet Take 1 tablet (500 mg total) by mouth 2 (two) times daily with a meal. (Patient taking differently: Take 1,000 mg by mouth daily with breakfast. ) 60 tablet 0  . oxybutynin (DITROPAN XL) 15 MG 24 hr tablet TAKE 1 TABLET(15 MG) BY MOUTH AT BEDTIME 90 tablet 1  . simvastatin (ZOCOR) 20 MG tablet TAKE 1 TABLET BY MOUTH  NIGHTLY    . SUMAtriptan (IMITREX) 50 MG tablet Take 1 tablet (50 mg total) by mouth 3 (three) times daily as needed for migraine or headache. May repeat in 2 hours if headache persists or recurs. 20 tablet 0  . verapamil (CALAN-SR) 240 MG CR tablet TAKE 1 TABLET BY MOUTH ONCE DAILY    . VYVANSE 20 MG capsule     . pantoprazole (PROTONIX) 40 MG tablet Take 1 tablet (40 mg total) by mouth daily. (Patient not taking: Reported on 01/28/2019) 30 tablet 0  . tamsulosin (FLOMAX) 0.4 MG CAPS capsule Take by mouth.     No current facility-administered medications for this visit.    Past Medical History:  Diagnosis Date  . Anxiety   . Cellulitis   . HTN (hypertension)   . Obesity   . Sleep apnea     Past Surgical History:  Procedure Laterality Date  . COLON SURGERY  1994   Diverticulitis  . HYDROCELE EXCISION / REPAIR  2009  .  VASECTOMY  2009   Family History  Problem Relation Age of Onset  . Lung cancer Father   Mother and sister with leg swelling No history of DVT or bleeding disorders  Social History Social History        Tobacco Use  . Smoking status: Former Smoker    Years: 40.00    Types: Cigarettes    Last attempt to quit: 08/21/2014    Years since quitting: 4.4  . Smokeless tobacco: Never Used  Substance Use Topics  . Alcohol use: Not Currently  . Drug use: Never     No Known Allergies  REVIEW OF SYSTEMS (Negative unless checked)  Constitutional: [] ?Weight loss  [] ?Fever  [] ?Chills Cardiac: [] ?Chest pain   [] ?Chest pressure   [] ?Palpitations   [] ?Shortness of breath when laying flat   [] ?Shortness of breath at rest   [] ?Shortness of breath with exertion. Vascular:  [x] ?Pain in legs with walking   [x] ?Pain in legs at rest   [] ?Pain in legs when laying flat   [] ?Claudication   [] ?Pain in feet when walking  [] ?Pain in feet at rest  [] ?Pain in feet when laying flat   [] ?History of DVT   [x] ?Phlebitis   [x] ?Swelling in legs   [] ?Varicose veins   [] ?Non-healing ulcers Pulmonary:   [] ?Uses home  oxygen   [] ?Productive cough   [] ?Hemoptysis   [] ?Wheeze  [] ?COPD   [] ?Asthma Neurologic:  [] ?Dizziness  [] ?Blackouts   [] ?Seizures   [] ?History of stroke   [] ?History of TIA  [] ?Aphasia   [] ?Temporary blindness   [] ?Dysphagia   [] ?Weakness or numbness in arms   [] ?Weakness or numbness in legs Musculoskeletal:  [x] ?Arthritis   [] ?Joint swelling   [] ?Joint pain   [] ?Low back pain Hematologic:  [] ?Easy bruising  [] ?Easy bleeding   [] ?Hypercoagulable state   [] ?Anemic  [] ?Hepatitis Gastrointestinal:  [] ?Blood in stool   [] ?Vomiting blood  [] ?Gastroesophageal reflux/heartburn   [] ?Abdominal pain Genitourinary:  [] ?Chronic kidney disease   [] ?Difficult urination  [] ?Frequent urination  [] ?Burning with urination   [] ?Hematuria Skin:  [] ?Rashes   [] ?Ulcers   [] ?Wounds Psychological:  [x] ?History of  anxiety   [] ? History of major depression.  Physical Examination  BP 136/86 (BP Location: Right Arm)   Pulse 98   Resp 16   Wt (!) 320 lb (145.2 kg)   BMI 44.63 kg/m  Gen:  WD/WN, NAD.  Obese Head: [place]/AT, No temporalis wasting. Ear/Nose/Throat: Hearing grossly intact, nares w/o erythema or drainage Eyes: Conjunctiva clear. Sclera non-icteric Neck: Supple.  Trachea midline Pulmonary:  Good air movement, no use of accessory muscles.  Cardiac: RRR, no JVD Vascular:  Vessel Right Left  Radial Palpable Palpable                          PT Palpable Palpable  DP Palpable Palpable   Gastrointestinal: soft, non-tender/non-distended. No guarding/reflex.  Musculoskeletal: M/S 5/5 throughout.  No deformity or atrophy.  Trace lower extremity edema. Neurologic: Sensation grossly intact in extremities.  Symmetrical.  Speech is fluent.  Psychiatric: Judgment intact, Mood & affect appropriate for pt's clinical situation. Dermatologic: No rashes or ulcers noted.  No cellulitis or open wounds.       Labs No results found for this or any previous visit (from the past 2160 hour(s)).  Radiology No results found.  Assessment/Plan Hypertension blood pressure control important in reducing the progression of atherosclerotic disease. On appropriate oral medications.   Morbid obesity with BMI of 40.0-44.9, adult ([place]) This certainly exacerbates his lower extremity swelling.  Weight loss and activity may help his lower extremity swelling.  Diabetes ([place]) blood glucose control important in reducing the progression of atherosclerotic disease. Also, involved in wound healing. On appropriate medications.  Lymphedema Daily use of compression stockings, leg elevation, and the use of the lymphedema pump in the evenings has markedly improved his symptoms.  He has very mild swelling which is under good control.  The patient is pleased with the results.  At this point, if his symptoms worsen we  will be happy to see him back but otherwise I will see him as needed    Leotis Pain, MD  08/05/2019 10:55 AM    This note was created with Dragon medical transcription system.  Any errors from dictation are purely unintentional

## 2019-08-05 NOTE — Assessment & Plan Note (Signed)
Daily use of compression stockings, leg elevation, and the use of the lymphedema pump in the evenings has markedly improved his symptoms.  He has very mild swelling which is under good control.  The patient is pleased with the results.  At this point, if his symptoms worsen we will be happy to see him back but otherwise I will see him as needed

## 2019-09-29 ENCOUNTER — Other Ambulatory Visit: Payer: Self-pay

## 2019-09-29 ENCOUNTER — Other Ambulatory Visit
Admission: RE | Admit: 2019-09-29 | Discharge: 2019-09-29 | Disposition: A | Discharge status: Home / Self Care | Payer: Managed Care, Other (non HMO) | Source: Ambulatory Visit | Attending: General Surgery | Admitting: General Surgery

## 2019-09-29 DIAGNOSIS — Z20822 Contact with and (suspected) exposure to covid-19: Secondary | ICD-10-CM | POA: Diagnosis not present

## 2019-09-29 DIAGNOSIS — Z01812 Encounter for preprocedural laboratory examination: Secondary | ICD-10-CM | POA: Insufficient documentation

## 2019-09-30 ENCOUNTER — Encounter: Payer: Self-pay | Admitting: General Surgery

## 2019-09-30 LAB — SARS CORONAVIRUS 2 (TAT 6-24 HRS): SARS Coronavirus 2: NEGATIVE

## 2019-10-01 ENCOUNTER — Ambulatory Visit
Admission: RE | Admit: 2019-10-01 | Discharge: 2019-10-01 | Disposition: A | Discharge status: Home / Self Care | Payer: Managed Care, Other (non HMO) | Attending: General Surgery | Admitting: General Surgery

## 2019-10-01 ENCOUNTER — Encounter
Admission: RE | Disposition: A | Discharge status: Home / Self Care | Payer: Self-pay | Source: Home / Self Care | Attending: General Surgery

## 2019-10-01 ENCOUNTER — Ambulatory Visit: Payer: Managed Care, Other (non HMO) | Admitting: Anesthesiology

## 2019-10-01 ENCOUNTER — Other Ambulatory Visit: Payer: Self-pay

## 2019-10-01 ENCOUNTER — Encounter: Payer: Self-pay | Admitting: General Surgery

## 2019-10-01 DIAGNOSIS — I10 Essential (primary) hypertension: Secondary | ICD-10-CM | POA: Diagnosis not present

## 2019-10-01 DIAGNOSIS — Z87891 Personal history of nicotine dependence: Secondary | ICD-10-CM | POA: Diagnosis not present

## 2019-10-01 DIAGNOSIS — G4730 Sleep apnea, unspecified: Secondary | ICD-10-CM | POA: Insufficient documentation

## 2019-10-01 DIAGNOSIS — K5730 Diverticulosis of large intestine without perforation or abscess without bleeding: Secondary | ICD-10-CM | POA: Insufficient documentation

## 2019-10-01 DIAGNOSIS — Z8719 Personal history of other diseases of the digestive system: Secondary | ICD-10-CM | POA: Insufficient documentation

## 2019-10-01 DIAGNOSIS — E119 Type 2 diabetes mellitus without complications: Secondary | ICD-10-CM | POA: Insufficient documentation

## 2019-10-01 DIAGNOSIS — Z6841 Body Mass Index (BMI) 40.0 and over, adult: Secondary | ICD-10-CM | POA: Diagnosis not present

## 2019-10-01 DIAGNOSIS — Z1211 Encounter for screening for malignant neoplasm of colon: Secondary | ICD-10-CM | POA: Insufficient documentation

## 2019-10-01 HISTORY — PX: COLONOSCOPY WITH PROPOFOL: SHX5780

## 2019-10-01 HISTORY — DX: Type 2 diabetes mellitus without complications: E11.9

## 2019-10-01 SURGERY — COLONOSCOPY WITH PROPOFOL
Anesthesia: General

## 2019-10-01 MED ORDER — PROPOFOL 10 MG/ML IV BOLUS
INTRAVENOUS | Status: DC | PRN
  Administered 2019-10-01: 80 mg via INTRAVENOUS

## 2019-10-01 MED ORDER — SODIUM CHLORIDE 0.9 % IV SOLN: INTRAVENOUS | Status: DC

## 2019-10-01 MED ORDER — PROPOFOL 500 MG/50ML IV EMUL
INTRAVENOUS | Status: DC | PRN
  Administered 2019-10-01: 150 ug/kg/min via INTRAVENOUS

## 2019-10-01 NOTE — Anesthesia Postprocedure Evaluation (Signed)
Anesthesia Post Note  Patient: Mathew Porter  Procedure(s) Performed: COLONOSCOPY WITH PROPOFOL (N/A )  Patient location during evaluation: Endoscopy Anesthesia Type: General Level of consciousness: awake and alert Pain management: pain level controlled Vital Signs Assessment: post-procedure vital signs reviewed and stable Respiratory status: spontaneous breathing, nonlabored ventilation, respiratory function stable and patient connected to nasal cannula oxygen Cardiovascular status: blood pressure returned to baseline and stable Postop Assessment: no apparent nausea or vomiting Anesthetic complications: no     Last Vitals:  Vitals:   10/01/19 1008 10/01/19 1018  BP: (!) 154/90 (!) 149/83  Pulse: 95 89  Resp: (!) 21 20  Temp:    SpO2: 96% 97%    Last Pain:  Vitals:   10/01/19 1018  TempSrc:   PainSc: 0-No pain                 Cleda Mccreedy Piscitello

## 2019-10-01 NOTE — Anesthesia Preprocedure Evaluation (Signed)
Anesthesia Evaluation  Patient identified by MRN, date of birth, ID band Patient awake    Reviewed: Allergy & Precautions, H&P , NPO status , Patient's Chart, lab work & pertinent test results  Airway Mallampati: III  TM Distance: <3 FB Neck ROM: full    Dental  (+) Chipped   Pulmonary neg shortness of breath, sleep apnea , former smoker,           Cardiovascular Exercise Tolerance: Good hypertension, (-) angina(-) Past MI and (-) DOE      Neuro/Psych  Headaches, PSYCHIATRIC DISORDERS    GI/Hepatic negative GI ROS, Neg liver ROS,   Endo/Other  diabetes, Type 2  Renal/GU negative Renal ROS  negative genitourinary   Musculoskeletal   Abdominal   Peds  Hematology negative hematology ROS (+)   Anesthesia Other Findings Past Medical History: No date: Anxiety No date: Cellulitis No date: Diabetes mellitus without complication (HCC) No date: HTN (hypertension) No date: Obesity No date: Sleep apnea  Past Surgical History: 1994: COLON SURGERY     Comment:  Diverticulitis 2009: HYDROCELE EXCISION / REPAIR 2009: VASECTOMY  BMI    Body Mass Index: 42.40 kg/m      Reproductive/Obstetrics negative OB ROS                             Anesthesia Physical Anesthesia Plan  ASA: III  Anesthesia Plan: General   Post-op Pain Management:    Induction: Intravenous  PONV Risk Score and Plan: Propofol infusion and TIVA  Airway Management Planned: Natural Airway and Nasal Cannula  Additional Equipment:   Intra-op Plan:   Post-operative Plan:   Informed Consent: I have reviewed the patients History and Physical, chart, labs and discussed the procedure including the risks, benefits and alternatives for the proposed anesthesia with the patient or authorized representative who has indicated his/her understanding and acceptance.     Dental Advisory Given  Plan Discussed with:  Anesthesiologist, CRNA and Surgeon  Anesthesia Plan Comments: (Patient consented for risks of anesthesia including but not limited to:  - adverse reactions to medications - risk of intubation if required - damage to teeth, lips or other oral mucosa - sore throat or hoarseness - Damage to heart, brain, lungs or loss of life  Patient voiced understanding.)        Anesthesia Quick Evaluation

## 2019-10-01 NOTE — Op Note (Signed)
[facility] Patient Name: Mathew Porter Procedure Date: 10/01/2019 9:04 AM MRN: [id number] Account #: [id number] Date of Birth: 1956-03-28 Admit Type: Outpatient Age: 64 Room: [facility] ENDO ROOM 1 Gender: Male Note Status: Finalized Procedure:             Colonoscopy Indications:           High risk colon cancer surveillance: Personal history                         of colonic polyps Providers:             Earline Mayotte, MD Medicines:             Monitored Anesthesia Care Complications:         No immediate complications. Procedure:             Pre-Anesthesia Assessment:                        - Prior to the procedure, a History and Physical was                         performed, and patient medications, allergies and                         sensitivities were reviewed. The patient's tolerance                         of previous anesthesia was reviewed.                        - The risks and benefits of the procedure and the                         sedation options and risks were discussed with the                         patient. All questions were answered and informed                         consent was obtained.                        After obtaining informed consent, the colonoscope was                         passed under direct vision. Throughout the procedure,                         the patient's blood pressure, pulse, and oxygen                         saturations were monitored continuously. The                         Colonoscope was introduced through the anus and                         advanced to the the cecum, identified by appendiceal  orifice and ileocecal valve. The colonoscopy was                         performed without difficulty. The patient tolerated                         the procedure well. The quality of the bowel                         preparation was good. Findings:      Multiple  medium-mouthed diverticula were found in the rectum,       recto-sigmoid colon, sigmoid colon, descending colon, transverse colon,       hepatic flexure, ascending colon and cecum.      The retroflexed view of the distal rectum and anal verge was normal and       showed no anal or rectal abnormalities. Impression:            - Diverticulosis in the rectum, in the recto-sigmoid                         colon, in the sigmoid colon, in the descending colon,                         in the transverse colon, at the hepatic flexure, in                         the ascending colon and in the cecum.                        - The distal rectum and anal verge are normal on                         retroflexion view.                        - No specimens collected. Recommendation:        - Repeat colonoscopy in 5 years for surveillance. Diagnosis Code(s):     --- Professional ---                        Z86.010, Personal history of colonic polyps                        K57.30, Diverticulosis of large intestine without                         perforation or abscess without bleeding Robert Bellow, MD 10/01/2019 9:36:25 AM This report has been signed electronically. Number of Addenda: 0 Note Initiated On: 10/01/2019 9:04 AM Scope Withdrawal Time: 0 hours 14 minutes 55 seconds  Total Procedure Duration: 0 hours 17 minutes 27 seconds  Estimated Blood Loss:  Estimated blood loss: none.      [facility]

## 2019-10-01 NOTE — H&P (Signed)
Mathew Porter [id number] 1956-07-01     HPI:  64 year old male with past history of colonic polyps. 2015 exam with Dr. Tiffany Kocher showed only hyperpastic polyps in the recto-sigmoid and rectum. For repeat exam. Tolerated prep well.  Medications Prior to Admission  Medication Sig Dispense Refill Last Dose  . aspirin-acetaminophen-caffeine (EXCEDRIN MIGRAINE) 250-250-65 MG tablet Take by mouth.   09/30/2019 at 1400  . metFORMIN (GLUCOPHAGE) 500 MG tablet Take 1 tablet (500 mg total) by mouth 2 (two) times daily with a meal. (Patient taking differently: Take 1,000 mg by mouth daily with breakfast. ) 60 tablet 0 Past Week at Unknown time  . oxybutynin (DITROPAN XL) 15 MG 24 hr tablet TAKE 1 TABLET(15 MG) BY MOUTH AT BEDTIME 90 tablet 1 Past Month at Unknown time  . simvastatin (ZOCOR) 20 MG tablet TAKE 1 TABLET BY MOUTH  NIGHTLY   Past Week at Unknown time  . SUMAtriptan (IMITREX) 50 MG tablet Take 1 tablet (50 mg total) by mouth 3 (three) times daily as needed for migraine or headache. May repeat in 2 hours if headache persists or recurs. 20 tablet 0 Past Week at Unknown time  . verapamil (CALAN-SR) 240 MG CR tablet TAKE 1 TABLET BY MOUTH ONCE DAILY   Past Week at Unknown time  . VYVANSE 20 MG capsule    09/30/2019 at 0800  . pantoprazole (PROTONIX) 40 MG tablet Take 1 tablet (40 mg total) by mouth daily. (Patient not taking: Reported on 01/28/2019) 30 tablet 0 Not Taking at Unknown time  . tamsulosin (FLOMAX) 0.4 MG CAPS capsule Take by mouth.   Not Taking at Unknown time   No Known Allergies Past Medical History:  Diagnosis Date  . Anxiety   . Cellulitis   . Diabetes mellitus without complication ([place])   . HTN (hypertension)   . Obesity   . Sleep apnea    Past Surgical History:  Procedure Laterality Date  . COLON SURGERY  1994   Diverticulitis  . HYDROCELE EXCISION / REPAIR  2009  . VASECTOMY  2009   Social History   Socioeconomic History  . Marital status: Married    Spouse name: Not  on file  . Number of children: Not on file  . Years of education: Not on file  . Highest education level: Not on file  Occupational History  . Not on file  Tobacco Use  . Smoking status: Former Smoker    Years: 40.00    Types: Cigarettes    Quit date: 08/21/2014    Years since quitting: 5.1  . Smokeless tobacco: Never Used  Substance and Sexual Activity  . Alcohol use: Not Currently  . Drug use: Never  . Sexual activity: Yes  Other Topics Concern  . Not on file  Social History Narrative  . Not on file   Social Determinants of Health   Financial Resource Strain:   . Difficulty of Paying Living Expenses: Not on file  Food Insecurity:   . Worried About Charity fundraiser in the Last Year: Not on file  . Ran Out of Food in the Last Year: Not on file  Transportation Needs:   . Lack of Transportation (Medical): Not on file  . Lack of Transportation (Non-Medical): Not on file  Physical Activity:   . Days of Exercise per Week: Not on file  . Minutes of Exercise per Session: Not on file  Stress:   . Feeling of Stress : Not on file  Social  Connections:   . Frequency of Communication with Friends and Family: Not on file  . Frequency of Social Gatherings with Friends and Family: Not on file  . Attends Religious Services: Not on file  . Active Member of Clubs or Organizations: Not on file  . Attends Banker Meetings: Not on file  . Marital Status: Not on file  Intimate Partner Violence:   . Fear of Current or Ex-Partner: Not on file  . Emotionally Abused: Not on file  . Physically Abused: Not on file  . Sexually Abused: Not on file   Social History   Social History Narrative  . Not on file     ROS: Negative.     PE: HEENT: Negative. Lungs: Clear. Cardio: RR.   Assessment/Plan:  Proceed with planned endoscopy. Merrily Pew [facility] 10/01/2019

## 2019-10-01 NOTE — Transfer of Care (Signed)
Immediate Anesthesia Transfer of Care Note  Patient: Mathew Porter  Procedure(s) Performed: COLONOSCOPY WITH PROPOFOL (N/A )  Patient Location: PACU  Anesthesia Type:General  Level of Consciousness: awake, alert  and oriented  Airway & Oxygen Therapy: Patient Spontanous Breathing  Post-op Assessment: Report given to RN and Post -op Vital signs reviewed and stable  Post vital signs: Reviewed and stable  Last Vitals:  Vitals Value Taken Time  BP 147/79 10/01/19 0938  Temp    Pulse 105 10/01/19 0938  Resp 18 10/01/19 0938  SpO2 93 % 10/01/19 0938  Vitals shown include unvalidated device data.  Last Pain:  Vitals:   10/01/19 0830  PainSc: 0-No pain         Complications: No apparent anesthesia complications

## 2019-10-02 ENCOUNTER — Encounter: Payer: Self-pay | Admitting: *Deleted

## 2019-11-14 ENCOUNTER — Ambulatory Visit: Payer: Managed Care, Other (non HMO) | Attending: Internal Medicine

## 2019-11-14 DIAGNOSIS — Z23 Encounter for immunization: Secondary | ICD-10-CM

## 2019-11-14 NOTE — Progress Notes (Signed)
   Covid-19 Vaccination Clinic  Name:  STARSKY NANNA    MRN: [id number] DOB: November 19, 1955  11/14/2019  Mr. Flanery was observed post Covid-19 immunization for 15 minutes without incident. He was provided with Vaccine Information Sheet and instruction to access the V-Safe system.   Mr. Savini was instructed to call 911 with any severe reactions post vaccine: Marland Kitchen Difficulty breathing  . Swelling of face and throat  . A fast heartbeat  . A bad rash all over body  . Dizziness and weakness   Immunizations Administered    Name Date Dose VIS Date Route   Moderna COVID-19 Vaccine 11/14/2019 10:00 AM 0.5 mL 07/22/2019 Intramuscular   Manufacturer: Moderna   Lot: [id number]   NDC: 44010-272-53

## 2019-12-01 ENCOUNTER — Other Ambulatory Visit: Payer: Self-pay | Admitting: Urology

## 2019-12-01 DIAGNOSIS — N3281 Overactive bladder: Secondary | ICD-10-CM

## 2019-12-01 DIAGNOSIS — R351 Nocturia: Secondary | ICD-10-CM

## 2019-12-17 ENCOUNTER — Ambulatory Visit: Payer: Managed Care, Other (non HMO) | Attending: Internal Medicine

## 2019-12-17 DIAGNOSIS — Z23 Encounter for immunization: Secondary | ICD-10-CM

## 2019-12-17 NOTE — Progress Notes (Signed)
   Covid-19 Vaccination Clinic  Name:  Mathew Porter    MRN: [id number] DOB: 01-Dec-1955  12/17/2019  Mr. Mathew Porter was observed post Covid-19 immunization for 15 minutes without incident. He was provided with Vaccine Information Sheet and instruction to access the V-Safe system.   Mr. Mathew Porter was instructed to call 911 with any severe reactions post vaccine: Marland Kitchen Difficulty breathing  . Swelling of face and throat  . A fast heartbeat  . A bad rash all over body  . Dizziness and weakness   Immunizations Administered    Name Date Dose VIS Date Route   Moderna COVID-19 Vaccine 12/17/2019 10:44 AM 0.5 mL 07/2019 Intramuscular   Manufacturer: Moderna   Lot: [id number]   NDC: 33383-291-91

## 2019-12-28 ENCOUNTER — Other Ambulatory Visit: Payer: Self-pay

## 2019-12-28 ENCOUNTER — Encounter: Payer: Self-pay | Admitting: Emergency Medicine

## 2019-12-28 ENCOUNTER — Emergency Department
Admission: EM | Admit: 2019-12-28 | Discharge: 2019-12-28 | Disposition: A | Discharge status: Home / Self Care | Payer: Managed Care, Other (non HMO) | Attending: Emergency Medicine | Admitting: Emergency Medicine

## 2019-12-28 ENCOUNTER — Emergency Department: Payer: Managed Care, Other (non HMO)

## 2019-12-28 DIAGNOSIS — R42 Dizziness and giddiness: Secondary | ICD-10-CM | POA: Diagnosis not present

## 2019-12-28 DIAGNOSIS — Z79899 Other long term (current) drug therapy: Secondary | ICD-10-CM | POA: Insufficient documentation

## 2019-12-28 DIAGNOSIS — R260 Ataxic gait: Secondary | ICD-10-CM | POA: Insufficient documentation

## 2019-12-28 DIAGNOSIS — R110 Nausea: Secondary | ICD-10-CM | POA: Diagnosis not present

## 2019-12-28 DIAGNOSIS — Z7984 Long term (current) use of oral hypoglycemic drugs: Secondary | ICD-10-CM | POA: Insufficient documentation

## 2019-12-28 DIAGNOSIS — I10 Essential (primary) hypertension: Secondary | ICD-10-CM | POA: Insufficient documentation

## 2019-12-28 DIAGNOSIS — E119 Type 2 diabetes mellitus without complications: Secondary | ICD-10-CM | POA: Insufficient documentation

## 2019-12-28 LAB — DIFFERENTIAL
Abs Immature Granulocytes: 0.04 10*3/uL (ref 0.00–0.07)
Basophils Absolute: 0 10*3/uL (ref 0.0–0.1)
Basophils Relative: 0 %
Eosinophils Absolute: 0.1 10*3/uL (ref 0.0–0.5)
Eosinophils Relative: 1 %
Immature Granulocytes: 0 %
Lymphocytes Relative: 18 %
Lymphs Abs: 1.9 10*3/uL (ref 0.7–4.0)
Monocytes Absolute: 0.5 10*3/uL (ref 0.1–1.0)
Monocytes Relative: 5 %
Neutro Abs: 8.1 10*3/uL — ABNORMAL HIGH (ref 1.7–7.7)
Neutrophils Relative %: 76 %

## 2019-12-28 LAB — CBC
HCT: 43.3 % (ref 39.0–52.0)
Hemoglobin: 13.8 g/dL (ref 13.0–17.0)
MCH: 27.8 pg (ref 26.0–34.0)
MCHC: 31.9 g/dL (ref 30.0–36.0)
MCV: 87.3 fL (ref 80.0–100.0)
Platelets: 258 10*3/uL (ref 150–400)
RBC: 4.96 MIL/uL (ref 4.22–5.81)
RDW: 16.6 % — ABNORMAL HIGH (ref 11.5–15.5)
WBC: 10.6 10*3/uL — ABNORMAL HIGH (ref 4.0–10.5)
nRBC: 0 % (ref 0.0–0.2)

## 2019-12-28 LAB — PROTIME-INR
INR: 1 (ref 0.8–1.2)
Prothrombin Time: 13 seconds (ref 11.4–15.2)

## 2019-12-28 LAB — COMPREHENSIVE METABOLIC PANEL
ALT: 23 U/L (ref 0–44)
AST: 21 U/L (ref 15–41)
Albumin: 3.8 g/dL (ref 3.5–5.0)
Alkaline Phosphatase: 59 U/L (ref 38–126)
Anion gap: 9 (ref 5–15)
BUN: 15 mg/dL (ref 8–23)
CO2: 21 mmol/L — ABNORMAL LOW (ref 22–32)
Calcium: 8.9 mg/dL (ref 8.9–10.3)
Chloride: 107 mmol/L (ref 98–111)
Creatinine, Ser: 0.74 mg/dL (ref 0.61–1.24)
GFR calc Af Amer: >60 mL/min (ref >60)
GFR calc non Af Amer: >60 mL/min (ref >60)
Glucose, Bld: 158 mg/dL — ABNORMAL HIGH (ref 70–99)
Potassium: 4.1 mmol/L (ref 3.5–5.1)
Sodium: 137 mmol/L (ref 135–145)
Total Bilirubin: 0.5 mg/dL (ref 0.3–1.2)
Total Protein: 7.1 g/dL (ref 6.5–8.1)

## 2019-12-28 LAB — TROPONIN I (HIGH SENSITIVITY): Troponin I (High Sensitivity): 3 ng/L (ref <18)

## 2019-12-28 LAB — APTT: aPTT: 29 seconds (ref 24–36)

## 2019-12-28 MED ORDER — SODIUM CHLORIDE 0.9 % IV BOLUS
1000.0000 mL | Freq: Once | INTRAVENOUS | Status: AC
  Administered 2019-12-28: 1000 mL via INTRAVENOUS

## 2019-12-28 MED ORDER — MECLIZINE HCL 25 MG PO TABS
25.0000 mg | ORAL_TABLET | Freq: Once | ORAL | Status: AC
  Administered 2019-12-28: 25 mg via ORAL
  Filled 2019-12-28: qty 1

## 2019-12-28 MED ORDER — ONDANSETRON HCL 4 MG/2ML IJ SOLN
4.0000 mg | Freq: Once | INTRAMUSCULAR | Status: DC
  Filled 2019-12-28: qty 2

## 2019-12-28 MED ORDER — ONDANSETRON HCL 4 MG/2ML IJ SOLN
4.0000 mg | Freq: Once | INTRAMUSCULAR | Status: AC
  Administered 2019-12-28: 4 mg via INTRAVENOUS
  Filled 2019-12-28: qty 2

## 2019-12-28 MED ORDER — SODIUM CHLORIDE 0.9 % IV BOLUS: 1000.0000 mL | Freq: Once | INTRAVENOUS | Status: DC

## 2019-12-28 MED ORDER — MECLIZINE HCL 25 MG PO TABS: 25.0000 mg | ORAL_TABLET | Freq: Three times a day (TID) | ORAL | 0 refills | Status: DC | PRN

## 2019-12-28 NOTE — ED Provider Notes (Signed)
[facility] Emergency Department Provider Note  ____________________________________________   First MD Initiated Contact with Patient 12/28/19 [phone]     (approximate)  I have reviewed the triage vital signs and the nursing notes.   HISTORY  Chief Complaint Dizziness    HPI Mathew Porter is a 64 y.o. male with diabetes, hypertension, anxiety who comes in with dizziness that started around 330.  Patient said he initially woke up feeling like he was going to vomit but unable to.  He stated he had some very mild dizziness at that time.  Not really any severe abdominal pain kind of that upset stomach feeling.  He stated that he was able to fall back asleep and then we woke up at 7 AM he felt more dizzy to the point where he felt like it was hard to ambulate, felt the room was spinning, nothing made it better, worse with walking.  States that he had 1 episodes of nonbloody nonbilious vomiting and his symptoms got better.  Patient states that he was diagnosed recently with diabetes we want to make sure there is no signs of any complications.  He has been taking his Metformin.  Upon being in triage he went to the CT scanner and had another episode of emesis afterwards.  Patient states that he now feels at his baseline self.  Denies any dizziness, vomiting, abdominal pain, abdominal distention.  Patient does have a history of diverticulitis status post partial bowel resection.  Denies feeling more distended.  Still having bowel movements and passing gas.  Continues to deny no abdominal pain.  He denies any cardiac history denies any chest pain or shortness of breath associated with this.  He denies any associated weakness or difficulty speaking with nausea.  Patient does report having eaten out last night but denies anyone else being sick.  Patient does report a history of dizziness in the setting of low hemoglobin levels from a diverticulum bleed but denies have any blood or  black tarry stools          Past Medical History:  Diagnosis Date  . Anxiety   . Cellulitis   . Diabetes mellitus without complication (HCC)   . HTN (hypertension)   . Obesity   . Sleep apnea     Patient Active Problem List   Diagnosis Date Noted  . Lymphedema 01/31/2019  . Diabetes (HCC) 01/28/2019  . Swelling of limb 01/28/2019  . Left leg cellulitis 11/05/2018  . Anxiety 05/23/2018  . Chickenpox 05/23/2018  . Colon polyp 05/23/2018  . Diverticulosis 05/23/2018  . Headache 05/23/2018  . Hypertension 05/23/2018  . OAB (overactive bladder) 05/23/2018  . Morbid obesity with BMI of 40.0-44.9, adult (HCC) 12/21/2015    Past Surgical History:  Procedure Laterality Date  . COLON SURGERY  1994   Diverticulitis  . COLONOSCOPY WITH PROPOFOL N/A 10/01/2019   Procedure: COLONOSCOPY WITH PROPOFOL;  Surgeon: Earline Mayotte, MD;  Location: [facility] ENDOSCOPY;  Service: Endoscopy;  Laterality: N/A;  . HYDROCELE EXCISION / REPAIR  2009  . VASECTOMY  2009    Prior to Admission medications   Medication Sig Start Date End Date Taking? Authorizing Provider  aspirin-acetaminophen-caffeine (EXCEDRIN MIGRAINE) [phone] MG tablet Take by mouth.    [provider]  metFORMIN (GLUCOPHAGE) 500 MG tablet Take 1 tablet (500 mg total) by mouth 2 (two) times daily with a meal. Patient taking differently: Take 1,000 mg by mouth daily with breakfast.  11/08/18 11/08/19  Altamese Dilling,  MD  oxybutynin (DITROPAN XL) 15 MG 24 hr tablet TAKE 1 TABLET(15 MG) BY MOUTH AT BEDTIME 12/17/18   McGowan, Carollee Herter A, PA-C  pantoprazole (PROTONIX) 40 MG tablet Take 1 tablet (40 mg total) by mouth daily. Patient not taking: Reported on 01/28/2019 11/09/18   Altamese Dilling, MD  simvastatin (ZOCOR) 20 MG tablet TAKE 1 TABLET BY MOUTH  NIGHTLY 10/15/17   [provider]  SUMAtriptan (IMITREX) 50 MG tablet Take 1 tablet (50 mg total) by mouth 3 (three) times daily as needed for migraine  or headache. May repeat in 2 hours if headache persists or recurs. 11/08/18   Altamese Dilling, MD  tamsulosin (FLOMAX) 0.4 MG CAPS capsule Take by mouth. 04/26/18   [provider]  verapamil (CALAN-SR) 240 MG CR tablet TAKE 1 TABLET BY MOUTH ONCE DAILY 10/15/17   [provider]  VYVANSE 20 MG capsule  07/24/19   [provider]    Allergies Patient has no known allergies.  Family History  Problem Relation Age of Onset  . Lung cancer Father     Social History Social History   Tobacco Use  . Smoking status: Former Smoker    Years: 40.00    Types: Cigarettes    Quit date: 08/21/2014    Years since quitting: 5.3  . Smokeless tobacco: Never Used  Substance Use Topics  . Alcohol use: Not Currently  . Drug use: Never      Review of Systems Constitutional: No fever/chills Eyes: No visual changes. ENT: No sore throat. Cardiovascular: Denies chest pain. Respiratory: Denies shortness of breath. Gastrointestinal: No abdominal pain.  Positive vomiting no diarrhea.  No constipation. Genitourinary: Negative for dysuria. Musculoskeletal: Negative for back pain. Skin: Negative for rash. Neurological: Negative for headaches, focal weakness or numbness.  Positive dizziness All other ROS negative ____________________________________________   PHYSICAL EXAM:  VITAL SIGNS: ED Triage Vitals  Enc Vitals Group     BP 12/28/19 0810 140/78     Pulse Rate 12/28/19 0810 75     Resp 12/28/19 0810 18     Temp 12/28/19 0810 (!) 97.4 F (36.3 C)     Temp src --      SpO2 12/28/19 0810 97 %     Weight --      Height --      Head Circumference --      Peak Flow --      Pain Score 12/28/19 0808 0     Pain Loc --      Pain Edu? --      Excl. in GC? --     Constitutional: Alert and oriented. Well appearing and in no acute distress. Eyes: Conjunctivae are normal. EOMI. Head: Atraumatic. Nose: No congestion/rhinnorhea. Mouth/Throat: Mucous membranes are  moist.   Neck: No stridor. Trachea Midline. FROM Cardiovascular: Normal rate, regular rhythm. Grossly normal heart sounds.  Good peripheral circulation. Respiratory: Normal respiratory effort.  No retractions. Lungs CTAB. Gastrointestinal: Soft and nontender. No distention. No abdominal bruits.  Musculoskeletal: No lower extremity tenderness nor edema.  No joint effusions. Neurologic:  Normal speech and language. No gross focal neurologic deficits are appreciated.  Cranial 2 through 12 are intact.  Equal strength in arms and legs.  Finger-to-nose intact Psychiatric: Mood and affect are normal. Speech and behavior are normal. GU: Deferred   ____________________________________________   LABS (all labs ordered are listed, but only abnormal results are displayed)  Labs Reviewed  CBC - Abnormal; Notable for the following components:  Result Value   WBC 10.6 (*)    RDW 16.6 (*)    All other components within normal limits  DIFFERENTIAL - Abnormal; Notable for the following components:   Neutro Abs 8.1 (*)    All other components within normal limits  COMPREHENSIVE METABOLIC PANEL - Abnormal; Notable for the following components:   CO2 21 (*)    Glucose, Bld 158 (*)    All other components within normal limits  PROTIME-INR  APTT  TROPONIN I (HIGH SENSITIVITY)   ____________________________________________   ED ECG REPORT I, Vanessa Vernon, the attending physician, personally viewed and interpreted this ECG.  EKG is normal sinus rate of 63, no ST elevation, T wave inversion in aVL, normal intervals ____________________________________________  RADIOLOGY Robert Bellow, personally viewed and evaluated these images (plain radiographs) as part of my medical decision making, as well as reviewing the written report by the radiologist.  ED MD interpretation: No intracranial hemorrhage or mass  Official radiology report(s): CT HEAD WO CONTRAST  Result Date: 12/28/2019 CLINICAL  DATA:  Possible stroke; neuro deficit, subacute. Additional provided: Dizziness, upset stomach, 1 episode of vomiting. EXAM: CT HEAD WITHOUT CONTRAST TECHNIQUE: Contiguous axial images were obtained from the base of the skull through the vertex without intravenous contrast. COMPARISON:  MRI/MRA head 09/18/2010 FINDINGS: Brain: Minimal ill-defined hypoattenuation within the cerebral white matter is nonspecific, but consistent with chronic small vessel ischemic disease. Mild generalized parenchymal atrophy. There is no acute intracranial hemorrhage. No demarcated cortical infarct. No extra-axial fluid collection. No evidence of intracranial mass. No midline shift. Vascular: No hyperdense vessel. Skull: Normal. Negative for fracture or focal lesion. Sinuses/Orbits: Visualized orbits show no acute finding. No significant paranasal sinus disease or mastoid effusion at the imaged levels. IMPRESSION: 1. No evidence of acute intracranial abnormality. 2. Mild generalized parenchymal atrophy and chronic small vessel ischemic disease. Electronically Signed   By: Kellie Simmering DO   On: 12/28/2019 09:08    ____________________________________________   PROCEDURES  Procedure(s) performed (including Critical Care):  Procedures   ____________________________________________   INITIAL IMPRESSION / ASSESSMENT AND PLAN / ED COURSE  Mathew Porter was evaluated in Emergency Department on 12/28/2019 for the symptoms described in the history of present illness. He was evaluated in the context of the global COVID-19 pandemic, which necessitated consideration that the patient might be at risk for infection with the SARS-CoV-2 virus that causes COVID-19. Institutional protocols and algorithms that pertain to the evaluation of patients at risk for COVID-19 are in a state of rapid change based on information released by regulatory bodies including the CDC and federal and state organizations. These policies and algorithms  were followed during the patient's care in the ED.    Patient is a well-appearing 64 year old who comes in with nausea and vomiting and dizziness that after vomiting have since resolved.  Will get labs to evaluate for electrolyte abnormalities, AKI, anemia.  EKG without evidence of arrhythmia.  No chest pain or shortness of breath to suggest ACS and symptoms have since resolved after vomiting.  Patient did not hit his head CT scan ordered in triage to evaluate for for the dizziness to make sure no intracranial hemorrhage, tumor.  Patient has no abdominal tenderness, distention, changes in bowel movements to suggest SBO.  Given recently diagnosed with diabetes will get labs to make sure no evidence of DKA.  Unlikely to be posterior stroke given symptoms of all resolved patient has normal finger-to-nose and otherwise normal neuro exam  Labs are reassuring  No evidence of DKA, no anemia, no AKI.  CT head negative for acute pathology.  Discussed with patient that since his symptoms have since resolved that we can monitor him do a p.o. trial and if his symptoms are returning or he fails a p.o. trial we can continue further work-up.  Patient expressed understanding and felt comfortable with that plan.  10:48 AM reevaluated patient and states that his nausea is better that he started feel hungry.  Tolerated  a couple water.  He states he still just feels off and maybe a little bit lightheaded.  Discussed with doing ambulatory trial to make sure no signs of ataxia.  Discussed with family that this could just be a GI bug versus posterior stroke.  Lower suspicion for stroke given no nystagmus, no finger-to-nose discrepancy and will ambulate to make sure no signs of ataxia.  I discussed with family the only way to know about that as an MRI.  10:59 AM patient attempted to walk but appears ataxic and just feels off balance and does not feel his normal self.  Discussed with patient he is willing to stay for MRI as  well as get troponin to make sure no evidence of ACS.  Will place IV give some IV fluids and some antiemetics  Troponin was negative and his symptoms have been going on for greater than 3 hours.  MRI was still negative.  Patient still states that he is not having any nausea.  Repeat abdominal exam remains soft and nontender.  Patient states that he just feels a little unsteady on his feet.  States that he has not slept well and that he is really hungry because he not eat anything and states that it may just be related to that.  Patient states that he is not too worried about it.  We discussed admission if he feels like he is unsafe to ambulate versus going home.  Patient states that he would prefer to go home.  Will trial some meclizine as well in case this could be some vertigo since he does report having history of that previously.  Patient understands that if his symptoms are not resolving or getting better that he should return to the ER.  I discussed the provisional nature of ED diagnosis, the treatment so far, the ongoing plan of care, follow up appointments and return precautions with the patient and any family or support people present. They expressed understanding and agreed with the plan, discharged home.  ____________________________________________   FINAL CLINICAL IMPRESSION(S) / ED DIAGNOSES   Final diagnoses:  Vertigo  Nausea      MEDICATIONS GIVEN DURING THIS VISIT:  Medications  sodium chloride 0.9 % bolus 1,000 mL (0 mLs Intravenous Stopped 12/28/19 1255)  ondansetron (ZOFRAN) injection 4 mg (4 mg Intravenous Given 12/28/19 1107)  meclizine (ANTIVERT) tablet 25 mg (25 mg Oral Given 12/28/19 1255)     ED Discharge Orders         Ordered    meclizine (ANTIVERT) 25 MG tablet  3 times daily PRN     12/28/19 1314           Note:  This document was prepared using Dragon voice recognition software and may include unintentional dictation errors.   Concha Se,  MD 12/28/19 1315

## 2019-12-28 NOTE — ED Notes (Signed)
Pt transported to MRI 

## 2019-12-28 NOTE — ED Triage Notes (Signed)
Pt presents to ED via POV with c/o dizziness that started at approx 0330 this morning, pt also c/o upset stomach as well. Pt states when he woke up at approx 0645 dizziness had worsened and he was unable to walk due to the dizziness, pt states had 1 episode of vomiting and felt less dizzy. Pt ambulatory to triage desk at this time, no slurred speech, c/o mild dizziness at this time.

## 2019-12-28 NOTE — ED Notes (Signed)
Pt given water for PO challenge. Pt finished full cup of water and denies nausea at this time.

## 2019-12-28 NOTE — Discharge Instructions (Addendum)
Your work-up was reassuring.  We decided to hold off on CT imaging of your abdomen given there was no abdominal tenderness and you are still passing gas and your nausea seem to resolve.  Were trying some meclizine for your feeling off balance.  She go home and take a nap and eat some food and if your symptoms are not getting better in the next 24 hours you can return to the ER for further evaluation or follow-up with your primary care doctor.  1. Mildly motion degraded examination.  2. No evidence of acute intracranial abnormality, including acute  infarction.  3. Mild-to-moderate chronic small vessel ischemic disease,  progressed as compared MRI 09/18/2010.  4. Stable, mild generalized parenchymal atrophy.  5. Mild paranasal sinus mucosal thickening.  6. Small bilateral mastoid effusions.

## 2019-12-28 NOTE — ED Notes (Signed)
Pt returned from CT, noted to begin actively vomiting while in the lobby. Pt taken to room 24 via wheelchair by Gerilyn Pilgrim, EDT.

## 2019-12-28 NOTE — ED Notes (Signed)
Ambulated pt around the room. Pt states, "I just don't feel right." Pt c/o mild dizziness and feel like he is unsteady on his feet. Pt notably unsteady on his feet.

## 2020-02-20 ENCOUNTER — Ambulatory Visit: Payer: Managed Care, Other (non HMO) | Admitting: Urology

## 2021-01-24 ENCOUNTER — Other Ambulatory Visit: Payer: Self-pay | Admitting: Neurology

## 2021-01-24 DIAGNOSIS — R569 Unspecified convulsions: Secondary | ICD-10-CM

## 2021-02-03 ENCOUNTER — Other Ambulatory Visit: Payer: Self-pay

## 2021-02-03 ENCOUNTER — Ambulatory Visit
Admission: RE | Admit: 2021-02-03 | Discharge: 2021-02-03 | Disposition: A | Discharge status: Home / Self Care | Payer: Managed Care, Other (non HMO) | Source: Ambulatory Visit | Attending: Neurology | Admitting: Neurology

## 2021-02-03 DIAGNOSIS — R569 Unspecified convulsions: Secondary | ICD-10-CM | POA: Diagnosis not present

## 2021-03-26 IMAGING — CT CT HEAD W/O CM
3 series · 15 of 47 positions shown, 18 images · non-contrast
Comparison: MRI/MRA head 09/18/2010

CLINICAL DATA: Possible stroke; neuro deficit, subacute. Additional
provided: Dizziness, upset stomach, 1 episode of vomiting.

EXAM:
CT HEAD WITHOUT CONTRAST
TECHNIQUE: Contiguous axial images were obtained from the base of the skull
through the vertex without intravenous contrast.

[Series 2: head wo · axial · 0.46mm/px · z∈[-150,-25]mm · 9 of 31 slices shown, 12 images]
[im 3/31  brain]
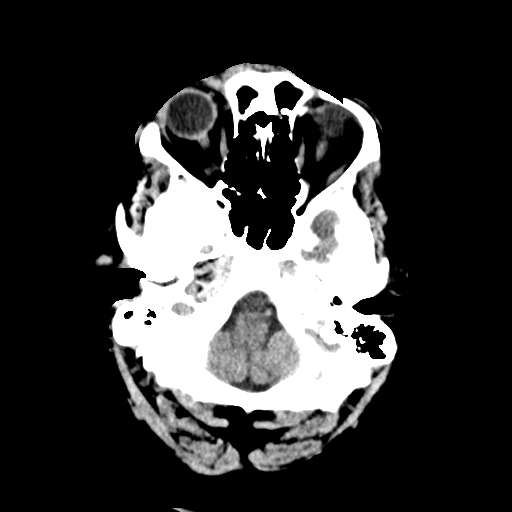
[im 3/31  bone]
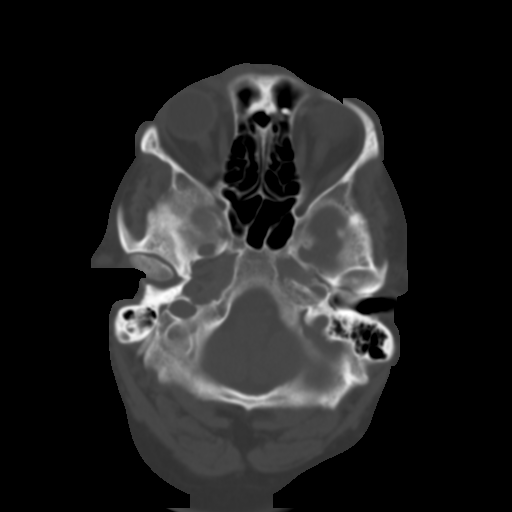
[im 6/31  brain]
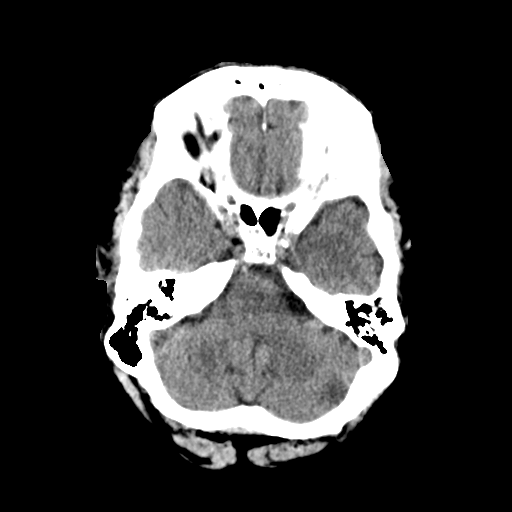
[im 9/31  brain]
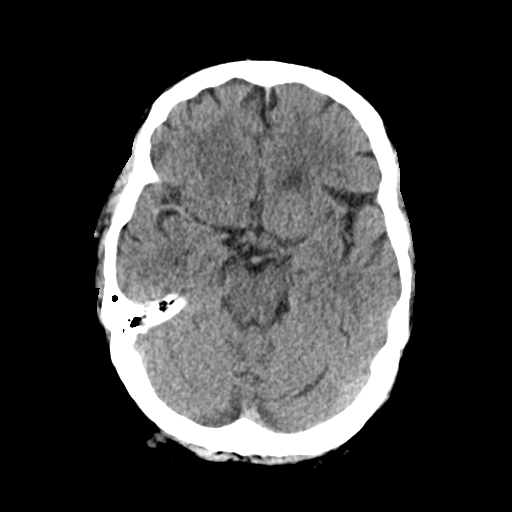
[im 12/31  brain]
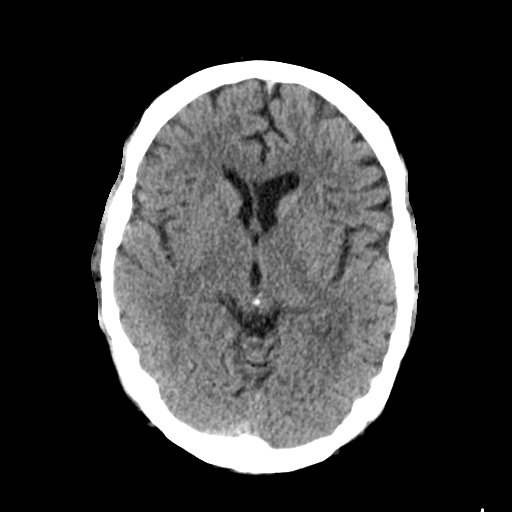
[im 16/31  brain]
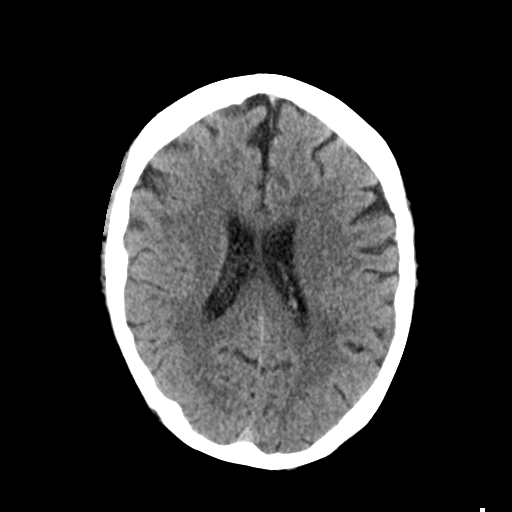
[im 16/31  bone]
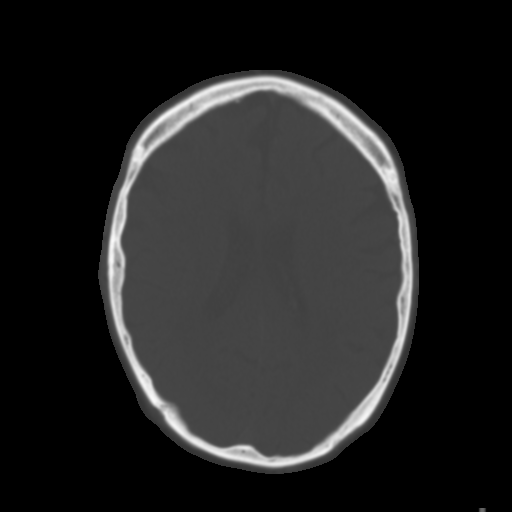
[im 19/31  brain]
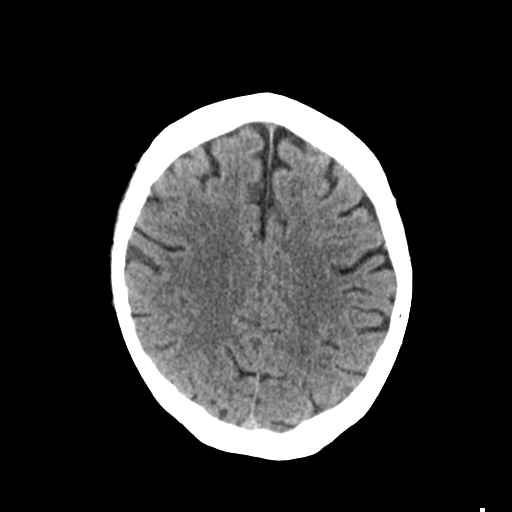
[im 22/31  brain]
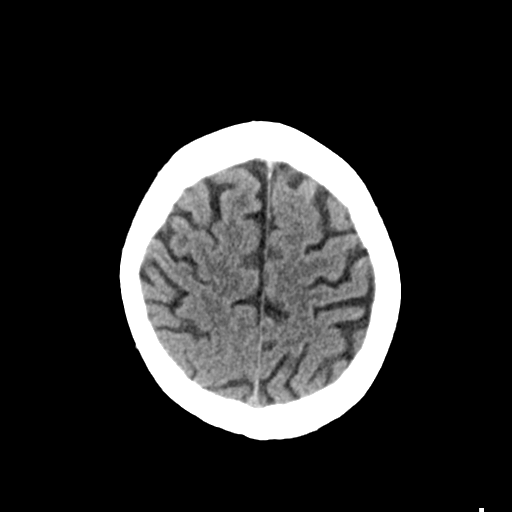
[im 25/31  brain]
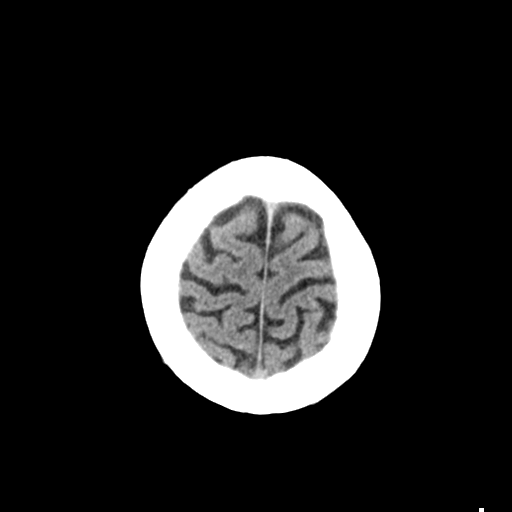
[im 28/31  brain]
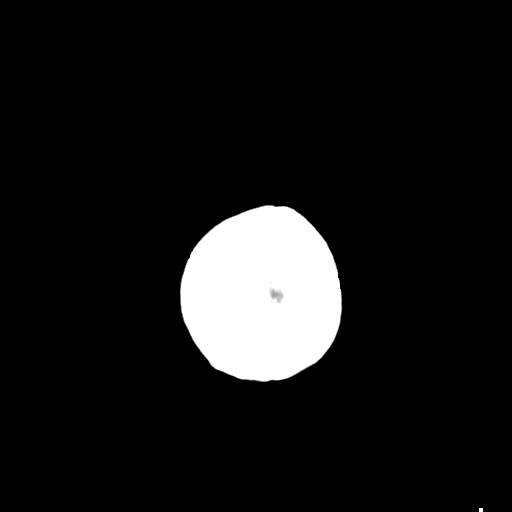
[im 28/31  bone]
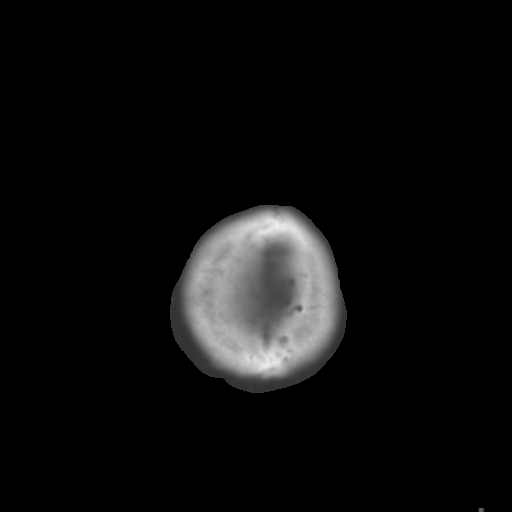

[Series 4: coronal soft tissue · coronal · 0.30mm/px · 3 of 70 slices shown]
[im 24/70  brain]
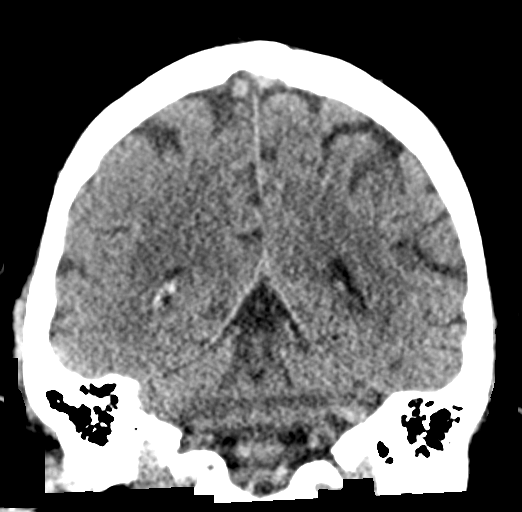
[im 31/70  brain]
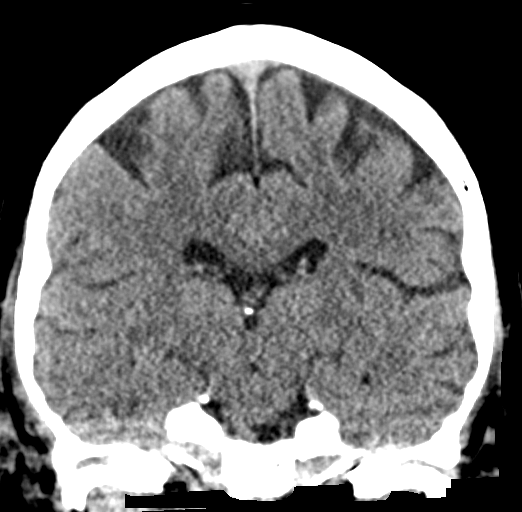
[im 39/70  brain]
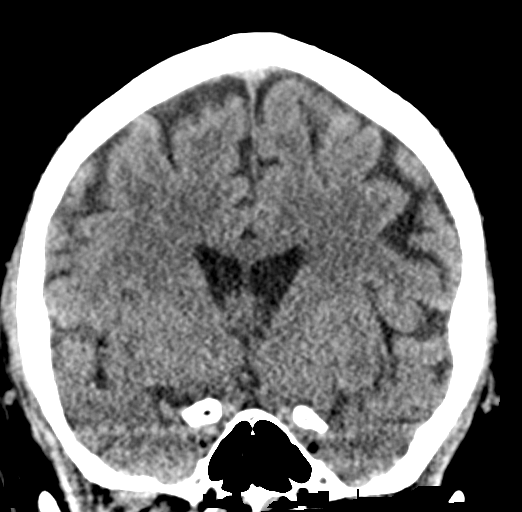

[Series 5: sagittal soft tissue · sagittal · 0.30mm/px · 3 of 54 slices shown]
[im 18/54  brain]
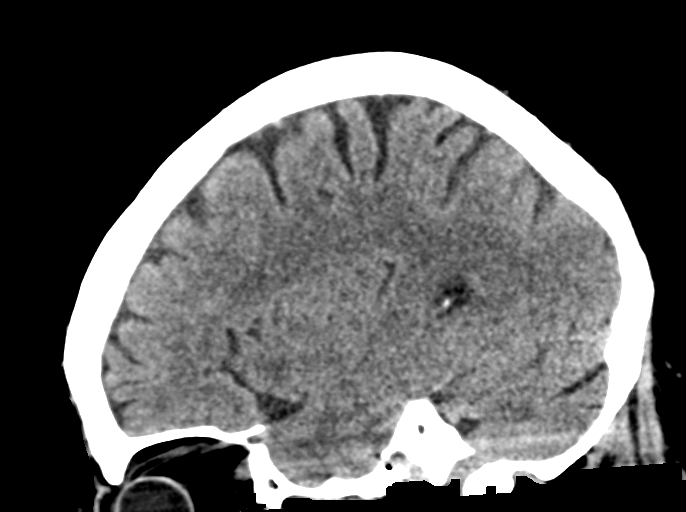
[im 27/54  brain]
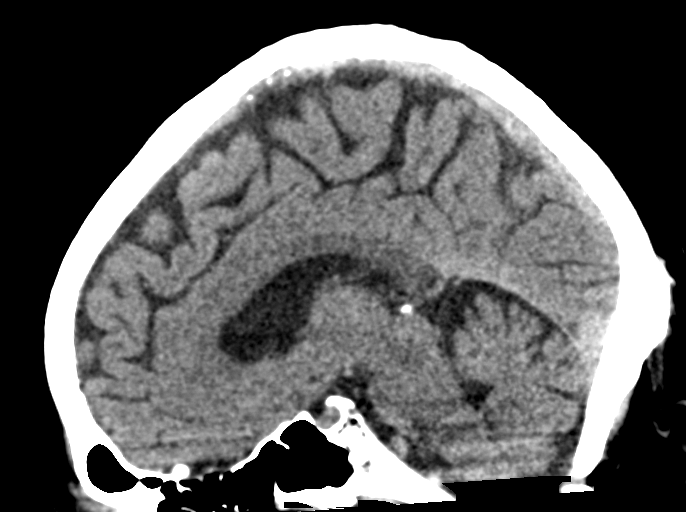
[im 36/54  brain]
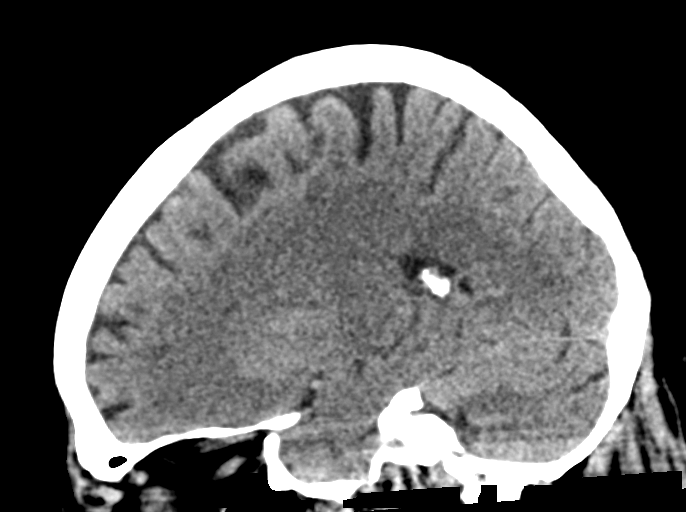

[15 of 47 positions shown; findings below may reference images not displayed]

FINDINGS: Brain:

Minimal ill-defined hypoattenuation within the cerebral white matter
is nonspecific, but consistent with chronic small vessel ischemic
disease. Mild generalized parenchymal atrophy.

There is no acute intracranial hemorrhage.

No demarcated cortical infarct.

No extra-axial fluid collection.

No evidence of intracranial mass.

No midline shift.

Vascular: No hyperdense vessel.

Skull: Normal. Negative for fracture or focal lesion.

Sinuses/Orbits: Visualized orbits show no acute finding. No
significant paranasal sinus disease or mastoid effusion at the
imaged levels.
IMPRESSION: 1. No evidence of acute intracranial abnormality.
2. Mild generalized parenchymal atrophy and chronic small vessel
ischemic disease.

## 2021-12-01 ENCOUNTER — Encounter (INDEPENDENT_AMBULATORY_CARE_PROVIDER_SITE_OTHER): Payer: Self-pay | Admitting: Vascular Surgery

## 2021-12-02 NOTE — Telephone Encounter (Signed)
This patient has a referral in to see [place] regarding this growth. As you can see by Dr. Driscilla Grammes note above this isn't something we would treat.  Please call Dr. Webb Silversmith office with recommendation to send a referral to Dr. Stephenie Acres (the hand specialist) at [facility].  I would also call the patient to inform him of Dr. Driscilla Grammes recommendation

## 2021-12-02 NOTE — Telephone Encounter (Signed)
For your review

## 2022-02-10 ENCOUNTER — Ambulatory Visit (INDEPENDENT_AMBULATORY_CARE_PROVIDER_SITE_OTHER): Payer: Managed Care, Other (non HMO) | Admitting: Vascular Surgery

## 2022-06-19 ENCOUNTER — Encounter (INDEPENDENT_AMBULATORY_CARE_PROVIDER_SITE_OTHER): Payer: Self-pay

## 2022-07-09 ENCOUNTER — Other Ambulatory Visit: Payer: Self-pay

## 2022-07-09 ENCOUNTER — Encounter: Payer: Self-pay | Admitting: Emergency Medicine

## 2022-07-09 ENCOUNTER — Emergency Department
Admission: EM | Admit: 2022-07-09 | Discharge: 2022-07-09 | Disposition: A | Discharge status: Home / Self Care | Payer: Managed Care, Other (non HMO) | Attending: Emergency Medicine | Admitting: Emergency Medicine

## 2022-07-09 DIAGNOSIS — E119 Type 2 diabetes mellitus without complications: Secondary | ICD-10-CM | POA: Diagnosis not present

## 2022-07-09 DIAGNOSIS — L03115 Cellulitis of right lower limb: Secondary | ICD-10-CM | POA: Diagnosis not present

## 2022-07-09 DIAGNOSIS — M79604 Pain in right leg: Secondary | ICD-10-CM | POA: Diagnosis present

## 2022-07-09 LAB — CBC WITH DIFFERENTIAL/PLATELET
Abs Immature Granulocytes: 0.05 10*3/uL (ref 0.00–0.07)
Basophils Absolute: 0 10*3/uL (ref 0.0–0.1)
Basophils Relative: 0 %
Eosinophils Absolute: 0.1 10*3/uL (ref 0.0–0.5)
Eosinophils Relative: 1 %
HCT: 43.6 % (ref 39.0–52.0)
Hemoglobin: 14.4 g/dL (ref 13.0–17.0)
Immature Granulocytes: 1 %
Lymphocytes Relative: 15 %
Lymphs Abs: 1.5 10*3/uL (ref 0.7–4.0)
MCH: 29.9 pg (ref 26.0–34.0)
MCHC: 33 g/dL (ref 30.0–36.0)
MCV: 90.6 fL (ref 80.0–100.0)
Monocytes Absolute: 0.8 10*3/uL (ref 0.1–1.0)
Monocytes Relative: 8 %
Neutro Abs: 7.5 10*3/uL (ref 1.7–7.7)
Neutrophils Relative %: 75 %
Platelets: 197 10*3/uL (ref 150–400)
RBC: 4.81 MIL/uL (ref 4.22–5.81)
RDW: 14 % (ref 11.5–15.5)
WBC: 9.9 10*3/uL (ref 4.0–10.5)
nRBC: 0 % (ref 0.0–0.2)

## 2022-07-09 LAB — LACTIC ACID, PLASMA: Lactic Acid, Venous: 1.3 mmol/L (ref 0.5–1.9)

## 2022-07-09 MED ORDER — CEPHALEXIN 500 MG PO CAPS: 500.0000 mg | ORAL_CAPSULE | Freq: Three times a day (TID) | ORAL | 0 refills | Status: AC

## 2022-07-09 MED ORDER — CEPHALEXIN 500 MG PO CAPS
500.0000 mg | ORAL_CAPSULE | Freq: Once | ORAL | Status: AC
  Administered 2022-07-09: 500 mg via ORAL
  Filled 2022-07-09: qty 1

## 2022-07-09 NOTE — ED Triage Notes (Addendum)
Pt via POV from home. Pt c/o possible R leg infection. Pt has a hx of cellulitis. States he noticed redness to the R lower leg. Pt has a hx of DM. Pt is A&Ox4 and NAD.

## 2022-07-09 NOTE — ED Provider Notes (Signed)
[facility] Provider Note    Event Date/Time   First MD Initiated Contact with Patient 07/09/22 [id number]     (approximate)   History   Leg Pain   HPI  Mathew Porter is a 66 y.o. male with history of diabetes who presents with complaints of right leg pain.  Patient reports he had chills overnight and pain in his right distal leg.  He noted it to be red this morning.  He is concerned about cellulitis.  He reports he is been hospitalized for cellulitis in the past when he "waited too long ".  No cough shortness of breath or abdominal pain.  No other areas of rash.     Physical Exam   Triage Vital Signs: ED Triage Vitals  Enc Vitals Group     BP 07/09/22 0817 (!) 163/74     Pulse Rate 07/09/22 0817 (!) 111     Resp 07/09/22 0817 20     Temp 07/09/22 0817 97.7 F (36.5 C)     Temp Source 07/09/22 0817 Oral     SpO2 07/09/22 0817 97 %     Weight 07/09/22 0815 (!) 163.3 kg (360 lb)     Height 07/09/22 0815 1.803 m (5\' 11" )     Head Circumference --      Peak Flow --      Pain Score 07/09/22 0815 1     Pain Loc --      Pain Edu? --      Excl. in GC? --     Most recent vital signs: Vitals:   07/09/22 0817  BP: (!) 163/74  Pulse: (!) 111  Resp: 20  Temp: 97.7 F (36.5 C)  SpO2: 97%     General: Awake, no distress.  CV:  Good peripheral perfusion.  Resp:  Normal effort.  Abd:  No distention.  Other:  Right distal leg, mild erythema extending from the medial aspect to the anterior aspect, no fluctuance or evidence of abscess   ED Results / Procedures / Treatments   Labs (all labs ordered are listed, but only abnormal results are displayed) Labs Reviewed  LACTIC ACID, PLASMA  CBC WITH DIFFERENTIAL/PLATELET  LACTIC ACID, PLASMA  COMPREHENSIVE METABOLIC PANEL     EKG     RADIOLOGY     PROCEDURES:  Critical Care performed:   Procedures   MEDICATIONS ORDERED IN ED: Medications  cephALEXin (KEFLEX) capsule 500 mg (500  mg Oral Given 07/09/22 0950)     IMPRESSION / MDM / ASSESSMENT AND PLAN / ED COURSE  I reviewed the triage vital signs and the nursing notes. Patient's presentation is most consistent with acute presentation with potential threat to life or bodily function.   Patient presents with chills as noted above, erythema to the leg most consistent with cellulitis.  Differential includes cellulitis, sepsis, viral illness unlikely given no congestion cough sore throat  We will obtain labs including lactic acid  Lactic acid is normal, white blood cell count is normal.  Patient's heart rate is normal on my exam  Not consistent with sepsis, will treat with p.o. antibiotics, strict return precautions of any worsening, patient agrees to this plan.       FINAL CLINICAL IMPRESSION(S) / ED DIAGNOSES   Final diagnoses:  Cellulitis of right lower extremity     Rx / DC Orders   ED Discharge Orders          Ordered    cephALEXin (KEFLEX) 500  MG capsule  3 times daily        07/09/22 [id number]             Note:  This document was prepared using Dragon voice recognition software and may include unintentional dictation errors.   Lavonia Drafts, MD 07/09/22 [phone]

## 2022-07-09 NOTE — ED Notes (Signed)
Lab called. Need to redraw green top.

## 2023-04-24 ENCOUNTER — Other Ambulatory Visit: Payer: Self-pay | Admitting: Sports Medicine

## 2023-04-24 DIAGNOSIS — M25461 Effusion, right knee: Secondary | ICD-10-CM

## 2023-04-24 DIAGNOSIS — G8929 Other chronic pain: Secondary | ICD-10-CM

## 2023-04-24 DIAGNOSIS — M7051 Other bursitis of knee, right knee: Secondary | ICD-10-CM

## 2023-05-04 ENCOUNTER — Ambulatory Visit
Admission: RE | Admit: 2023-05-04 | Discharge: 2023-05-04 | Disposition: A | Discharge status: Home / Self Care | Payer: Managed Care, Other (non HMO) | Source: Ambulatory Visit | Attending: Sports Medicine

## 2023-05-04 DIAGNOSIS — M25461 Effusion, right knee: Secondary | ICD-10-CM

## 2023-05-04 DIAGNOSIS — G8929 Other chronic pain: Secondary | ICD-10-CM

## 2023-05-04 DIAGNOSIS — M7051 Other bursitis of knee, right knee: Secondary | ICD-10-CM

## 2024-02-18 ENCOUNTER — Other Ambulatory Visit: Payer: Self-pay | Admitting: Surgery

## 2024-02-21 ENCOUNTER — Other Ambulatory Visit: Payer: Self-pay

## 2024-02-21 ENCOUNTER — Encounter
Admission: RE | Admit: 2024-02-21 | Discharge: 2024-02-21 | Disposition: A | Discharge status: Home / Self Care | Source: Ambulatory Visit | Attending: Surgery | Admitting: Surgery

## 2024-02-21 VITALS — BP 167/97 | HR 105 | Resp 18 | Ht 70.5 in | Wt 351.4 lb

## 2024-02-21 DIAGNOSIS — Z01810 Encounter for preprocedural cardiovascular examination: Secondary | ICD-10-CM | POA: Diagnosis not present

## 2024-02-21 DIAGNOSIS — E119 Type 2 diabetes mellitus without complications: Secondary | ICD-10-CM | POA: Insufficient documentation

## 2024-02-21 DIAGNOSIS — Z01812 Encounter for preprocedural laboratory examination: Secondary | ICD-10-CM

## 2024-02-21 DIAGNOSIS — Z01818 Encounter for other preprocedural examination: Secondary | ICD-10-CM | POA: Diagnosis present

## 2024-02-21 HISTORY — DX: Unspecified asthma, uncomplicated: J45.909

## 2024-02-21 HISTORY — DX: Anemia, unspecified: D64.9

## 2024-02-21 HISTORY — DX: Dyspnea, unspecified: R06.00

## 2024-02-21 HISTORY — DX: Headache, unspecified: R51.9

## 2024-02-21 HISTORY — DX: Pyogenic granuloma: L98.0

## 2024-02-21 HISTORY — DX: Unilateral primary osteoarthritis, right knee: M17.11

## 2024-02-21 HISTORY — DX: Pneumonia, unspecified organism: J18.9

## 2024-02-21 LAB — COMPREHENSIVE METABOLIC PANEL WITH GFR
ALT: 21 U/L (ref 0–44)
AST: 20 U/L (ref 15–41)
Albumin: 3.4 g/dL — ABNORMAL LOW (ref 3.5–5.0)
Alkaline Phosphatase: 61 U/L (ref 38–126)
Anion gap: 9 (ref 5–15)
BUN: 15 mg/dL (ref 8–23)
CO2: 24 mmol/L (ref 22–32)
Calcium: 9.1 mg/dL (ref 8.9–10.3)
Chloride: 105 mmol/L (ref 98–111)
Creatinine, Ser: 0.78 mg/dL (ref 0.61–1.24)
GFR, Estimated: >60 mL/min (ref >60)
Glucose, Bld: 109 mg/dL — ABNORMAL HIGH (ref 70–99)
Potassium: 3.8 mmol/L (ref 3.5–5.1)
Sodium: 138 mmol/L (ref 135–145)
Total Bilirubin: 0.5 mg/dL (ref 0.0–1.2)
Total Protein: 7.3 g/dL (ref 6.5–8.1)

## 2024-02-21 LAB — CBC WITH DIFFERENTIAL/PLATELET
Abs Immature Granulocytes: 0.03 10*3/uL (ref 0.00–0.07)
Basophils Absolute: 0 10*3/uL (ref 0.0–0.1)
Basophils Relative: 0 %
Eosinophils Absolute: 0.2 10*3/uL (ref 0.0–0.5)
Eosinophils Relative: 3 %
HCT: 43.7 % (ref 39.0–52.0)
Hemoglobin: 14.4 g/dL (ref 13.0–17.0)
Immature Granulocytes: 0 %
Lymphocytes Relative: 24 %
Lymphs Abs: 1.9 10*3/uL (ref 0.7–4.0)
MCH: 29.4 pg (ref 26.0–34.0)
MCHC: 33 g/dL (ref 30.0–36.0)
MCV: 89.2 fL (ref 80.0–100.0)
Monocytes Absolute: 0.6 10*3/uL (ref 0.1–1.0)
Monocytes Relative: 7 %
Neutro Abs: 5.4 10*3/uL (ref 1.7–7.7)
Neutrophils Relative %: 66 %
Platelets: 230 10*3/uL (ref 150–400)
RBC: 4.9 MIL/uL (ref 4.22–5.81)
RDW: 14 % (ref 11.5–15.5)
WBC: 8.2 10*3/uL (ref 4.0–10.5)
nRBC: 0 % (ref 0.0–0.2)

## 2024-02-21 LAB — URINALYSIS, ROUTINE W REFLEX MICROSCOPIC
Bilirubin Urine: NEGATIVE
Glucose, UA: NEGATIVE mg/dL
Hgb urine dipstick: NEGATIVE
Ketones, ur: NEGATIVE mg/dL
Leukocytes,Ua: NEGATIVE
Nitrite: NEGATIVE
Protein, ur: NEGATIVE mg/dL
Specific Gravity, Urine: 1.02 (ref 1.005–1.030)
pH: 5 (ref 5.0–8.0)

## 2024-02-21 LAB — SURGICAL PCR SCREEN
MRSA, PCR: NEGATIVE
Staphylococcus aureus: POSITIVE — AB

## 2024-02-21 NOTE — Patient Instructions (Addendum)
 Your procedure is scheduled on: 03/04/24 - Tuesday Report to the Registration Desk on the 1st floor of the [facility]. To find out your arrival time, please call [phone] between 1PM - 3PM on: 03/03/24 - Monday If your arrival time is 6:00 am, do not arrive before that time as the [facility] entrance doors do not open until 6:00 am.  REMEMBER: Instructions that are not followed completely may result in serious medical risk, up to and including death; or upon the discretion of your surgeon and anesthesiologist your surgery may need to be rescheduled.  Do not eat food after midnight the night before surgery.  No gum chewing or hard candies.  You may however, drink CLEAR liquids up to 2 hours before you are scheduled to arrive for your surgery. Do not drink anything within 2 hours of your scheduled arrival time.  Clear liquids include: - water   In addition, your doctor has ordered for you to drink the provided:  Gatorade G2 Drinking this carbohydrate drink up to two hours before surgery helps to reduce insulin  resistance and improve patient outcomes. Please complete drinking 2 hours before scheduled arrival time.  One week prior to surgery: Stop beginning 02/26/24, Anti-inflammatories (NSAIDS) such as Advil, Aleve, Ibuprofen, Motrin, Naproxen, Naprosyn and Aspirin  based products such as Excedrin , Goody's Powder, BC Powder. You may take Tylenol  if needed for pain up until the day of surgery.  Stop beginning 02/26/24, ANY OVER THE COUNTER supplements until after surgery : VITAMIN D3   HOLD metFORMIN  (GLUCOPHAGE -XR) beginning 03/02/24 - may resume after surgery.  HOLD Mounjaro 7 days prior to your surgery, hold your dose due on 07/11,may resume after surgery.  Continue taking all of your other prescription medications up until the day of surgery.  ON THE DAY OF SURGERY ONLY TAKE THESE MEDICATIONS WITH SIPS OF WATER:  none   No Alcohol for 24 hours before or after  surgery.  No Smoking including e-cigarettes for 24 hours before surgery.  No chewable tobacco products for at least 6 hours before surgery.  No nicotine patches on the day of surgery.  Do not use any recreational drugs for at least a week (preferably 2 weeks) before your surgery.  Please be advised that the combination of cocaine and anesthesia may have negative outcomes, up to and including death. If you test positive for cocaine, your surgery will be cancelled.  On the morning of surgery brush your teeth with toothpaste and water, you may rinse your mouth with mouthwash if you wish. Do not swallow any toothpaste or mouthwash.  Use CHG Soap or wipes as directed on instruction sheet.  Do not wear jewelry, make-up, hairpins, clips or nail polish.  For welded (permanent) jewelry: bracelets, anklets, waist bands, etc.  Please have this removed prior to surgery.  If it is not removed, there is a chance that hospital personnel will need to cut it off on the day of surgery.  Do not wear lotions, powders, or perfumes.   Do not shave body hair from the neck down 48 hours before surgery.  Contact lenses, hearing aids and dentures may not be worn into surgery.  Do not bring valuables to the hospital. [facility] is not responsible for any missing/lost belongings or valuables.   Notify your doctor if there is any change in your medical condition (cold, fever, infection).  Wear comfortable clothing (specific to your surgery type) to the hospital.  After surgery, you can help prevent lung complications by  doing breathing exercises.  Take deep breaths and cough every 1-2 hours. Your doctor may order a device called an Incentive Spirometer to help you take deep breaths.  When coughing or sneezing, hold a pillow firmly against your incision with both hands. This is called "splinting." Doing this helps protect your incision. It also decreases belly discomfort.  If you are being admitted to the  hospital overnight, leave your suitcase in the car. After surgery it may be brought to your room.  In case of increased patient census, it may be necessary for you, the patient, to continue your postoperative care in the Same Day Surgery department.  If you are being discharged the day of surgery, you will not be allowed to drive home. You will need a responsible individual to drive you home and stay with you for 24 hours after surgery.   If you are taking public transportation, you will need to have a responsible individual with you.  Please call the Pre-admissions Testing Dept. at [phone] if you have any questions about these instructions.  Surgery Visitation Policy:  Patients having surgery or a procedure may have two visitors.  Children under the age of 90 must have an adult with them who is not the patient.  Inpatient Visitation:    Visiting hours are 7 a.m. to 8 p.m. Up to four visitors are allowed at one time in a patient room. The visitors may rotate out with other people during the day.  One visitor age 19 or older may stay with the patient overnight and must be in the room by 8 p.m.   Merchandiser, retail to address health-related social needs:  [email or web address]    Pre-operative 5 CHG Bath Instructions   You can play a key role in reducing the risk of infection after surgery. Your skin needs to be as free of germs as possible. You can reduce the number of germs on your skin by washing with CHG (chlorhexidine gluconate) soap before surgery. CHG is an antiseptic soap that kills germs and continues to kill germs even after washing.   DO NOT use if you have an allergy to chlorhexidine/CHG or antibacterial soaps. If your skin becomes reddened or irritated, stop using the CHG and notify one of our RNs at [phone].   Please shower with the CHG soap starting 4 days before surgery using the following schedule: 07/11 - 07/15.    Please keep in  mind the following:  DO NOT shave, including legs and underarms, starting the day of your first shower.   You may shave your face at any point before/day of surgery.  Place clean sheets on your bed the day you start using CHG soap. Use a clean washcloth (not used since being washed) for each shower. DO NOT sleep with pets once you start using the CHG.   CHG Shower Instructions:  If you choose to wash your hair and private area, wash first with your normal shampoo/soap.  After you use shampoo/soap, rinse your hair and body thoroughly to remove shampoo/soap residue.  Turn the water OFF and apply about 3 tablespoons (45 ml) of CHG soap to a CLEAN washcloth.  Apply CHG soap ONLY FROM YOUR NECK DOWN TO YOUR TOES (washing for 3-5 minutes)  DO NOT use CHG soap on face, private areas, open wounds, or sores.  Pay special attention to the area where your surgery is being performed.  If you are having back surgery, having someone wash your back for you  may be helpful. Wait 2 minutes after CHG soap is applied, then you may rinse off the CHG soap.  Pat dry with a clean towel  Put on clean clothes/pajamas   If you choose to wear lotion, please use ONLY the CHG-compatible lotions on the back of this paper.     Additional instructions for the day of surgery: DO NOT APPLY any lotions, deodorants, cologne, or perfumes.   Put on clean/comfortable clothes.  Brush your teeth.  Ask your nurse before applying any prescription medications to the skin.      CHG Compatible Lotions   Aveeno Moisturizing lotion  Cetaphil Moisturizing Cream  Cetaphil Moisturizing Lotion  Clairol Herbal Essence Moisturizing Lotion, Dry Skin  Clairol Herbal Essence Moisturizing Lotion, Extra Dry Skin  Clairol Herbal Essence Moisturizing Lotion, Normal Skin  Curel Age Defying Therapeutic Moisturizing Lotion with Alpha Hydroxy  Curel Extreme Care Body Lotion  Curel Soothing Hands Moisturizing Hand Lotion  Curel Therapeutic  Moisturizing Cream, Fragrance-Free  Curel Therapeutic Moisturizing Lotion, Fragrance-Free  Curel Therapeutic Moisturizing Lotion, Original Formula  Eucerin Daily Replenishing Lotion  Eucerin Dry Skin Therapy Plus Alpha Hydroxy Crme  Eucerin Dry Skin Therapy Plus Alpha Hydroxy Lotion  Eucerin Original Crme  Eucerin Original Lotion  Eucerin Plus Crme Eucerin Plus Lotion  Eucerin TriLipid Replenishing Lotion  Keri Anti-Bacterial Hand Lotion  Keri Deep Conditioning Original Lotion Dry Skin Formula Softly Scented  Keri Deep Conditioning Original Lotion, Fragrance Free Sensitive Skin Formula  Keri Lotion Fast Absorbing Fragrance Free Sensitive Skin Formula  Keri Lotion Fast Absorbing Softly Scented Dry Skin Formula  Keri Original Lotion  Keri Skin Renewal Lotion Keri Silky Smooth Lotion  Keri Silky Smooth Sensitive Skin Lotion  Nivea Body Creamy Conditioning Oil  Nivea Body Extra Enriched Lotion  Nivea Body Original Lotion  Nivea Body Sheer Moisturizing Lotion Nivea Crme  Nivea Skin Firming Lotion  NutraDerm 30 Skin Lotion  NutraDerm Skin Lotion  NutraDerm Therapeutic Skin Cream  NutraDerm Therapeutic Skin Lotion  ProShield Protective Hand Cream  Provon moisturizing lotion  How to Use an Incentive Spirometer  An incentive spirometer is a tool that measures how well you are filling your lungs with each breath. Learning to take long, deep breaths using this tool can help you keep your lungs clear and active. This may help to reverse or lessen your chance of developing breathing (pulmonary) problems, especially infection. You may be asked to use a spirometer: After a surgery. If you have a lung problem or a history of smoking. After a long period of time when you have been unable to move or be active. If the spirometer includes an indicator to show the highest number that you have reached, your health care provider or respiratory therapist will help you set a goal. Keep a log of your  progress as told by your health care provider. What are the risks? Breathing too quickly may cause dizziness or cause you to pass out. Take your time so you do not get dizzy or light-headed. If you are in pain, you may need to take pain medicine before doing incentive spirometry. It is harder to take a deep breath if you are having pain. How to use your incentive spirometer  Sit up on the edge of your bed or on a chair. Hold the incentive spirometer so that it is in an upright position. Before you use the spirometer, breathe out normally. Place the mouthpiece in your mouth. Make sure your lips are closed tightly around it.  Breathe in slowly and as deeply as you can through your mouth, causing the piston or the ball to rise toward the top of the chamber. Hold your breath for 3-5 seconds, or for as long as possible. If the spirometer includes a coach indicator, use this to guide you in breathing. Slow down your breathing if the indicator goes above the marked areas. Remove the mouthpiece from your mouth and breathe out normally. The piston or ball will return to the bottom of the chamber. Rest for a few seconds, then repeat the steps 10 or more times. Take your time and take a few normal breaths between deep breaths so that you do not get dizzy or light-headed. Do this every 1-2 hours when you are awake. If the spirometer includes a goal marker to show the highest number you have reached (best effort), use this as a goal to work toward during each repetition. After each set of 10 deep breaths, cough a few times. This will help to make sure that your lungs are clear. If you have an incision on your chest or abdomen from surgery, place a pillow or a rolled-up towel firmly against the incision when you cough. This can help to reduce pain while taking deep breaths and coughing. General tips When you are able to get out of bed: Walk around often. Continue to take deep breaths and cough in order to  clear your lungs. Keep using the incentive spirometer until your health care provider says it is okay to stop using it. If you have been in the hospital, you may be told to keep using the spirometer at home. Contact a health care provider if: You are having difficulty using the spirometer. You have trouble using the spirometer as often as instructed. Your pain medicine is not giving enough relief for you to use the spirometer as told. You have a fever. Get help right away if: You develop shortness of breath. You develop a cough with bloody mucus from the lungs. You have fluid or blood coming from an incision site after you cough. Summary An incentive spirometer is a tool that can help you learn to take long, deep breaths to keep your lungs clear and active. You may be asked to use a spirometer after a surgery, if you have a lung problem or a history of smoking, or if you have been inactive for a long period of time. Use your incentive spirometer as instructed every 1-2 hours while you are awake. If you have an incision on your chest or abdomen, place a pillow or a rolled-up towel firmly against your incision when you cough. This will help to reduce pain. Get help right away if you have shortness of breath, you cough up bloody mucus, or blood comes from your incision when you cough. This information is not intended to replace advice given to you by your health care provider. Make sure you discuss any questions you have with your health care provider. Document Revised: 10/27/2019 Document Reviewed: 10/27/2019 Elsevier Patient Education  2023 Elsevier Inc.   Preoperative Educational Videos for Total Hip, Knee and Shoulder Replacements  To better prepare for surgery, please view our videos that explain the physical activity and discharge planning required to have the best surgical recovery at [facility]  [email or web address]   Questions? Call [phone] or email [email or web address]    POLAR CARE INFORMATION  MassAdvertisement.it  How to use [facility] Polar Care [facility] Therapy System?  YouTube  ShippingScam.co.uk  OPERATING INSTRUCTIONS  Start the product With dry hands, connect the transformer to the electrical connection located on the top of the cooler. Next, plug the transformer into an appropriate electrical outlet. The unit will automatically start running at this point.  To stop the pump, disconnect electrical power.  Unplug to stop the product when not in use. Unplugging the Polar Care unit turns it off. Always unplug immediately after use. Never leave it plugged in while unattended. Remove pad.    FIRST ADD WATER TO FILL LINE, THEN ICE---Replace ice when existing ice is almost melted  1 Discuss Treatment with your Licensed Health Care Practitioner and Use Only as Prescribed 2 Apply Insulation Barrier & Cold Therapy Pad 3 Check for Moisture 4 Inspect Skin Regularly  Tips and Trouble Shooting Usage Tips 1. Use cubed or chunked ice for optimal performance. 2. It is recommended to drain the Pad between uses. To drain the pad, hold the Pad upright with the hose pointed toward the ground. Depress the black plunger and allow water to drain out. 3. You may disconnect the Pad from the unit without removing the pad from the affected area by depressing the silver tabs on the hose coupling and gently pulling the hoses apart. The Pad and unit will seal itself and will not leak. Note: Some dripping during release is normal. 4. DO NOT RUN PUMP WITHOUT WATER! The pump in this unit is designed to run with water. Running the unit without water will cause permanent damage to the pump. 5. Unplug unit before removing lid.  TROUBLESHOOTING GUIDE Pump not running, Water not flowing to the pad,  Pad is not getting cold 1. Make sure the transformer is plugged into the wall outlet. 2. Confirm that the ice and water are filled to the indicated levels. 3. Make sure there are no kinks in the pad. 4. Gently pull on the blue tube to make sure the tube/pad junction is straight. 5. Remove the pad from the treatment site and ll it while the pad is lying at; then reapply. 6. Confirm that the pad couplings are securely attached to the unit. Listen for the double clicks (Figure 1) to confirm the pad couplings are securely attached.  Leaks    Note: Some condensation on the lines, controller, and pads is unavoidable, especially in warmer climates. 1. If using a Breg Polar Care Cold Therapy unit with a detachable Cold Therapy Pad, and a leak exists (other than condensation on the lines) disconnect the pad couplings. Make sure the silver tabs on the couplings are depressed before reconnecting the pad to the pump hose; then confirm both sides of the coupling are properly clicked in. 2. If the coupling continues to leak or a leak is detected in the pad itself, stop using it and call [employer] Customer Care at [phone].  Cleaning After use, empty and dry the unit with a soft cloth. Warm water and mild detergent may be used occasionally to clean the pump and tubes.  WARNING: The Polar Care Cube can be cold enough to cause serious injury, including full skin necrosis. Follow these Operating Instructions, and carefully read the Product Insert (see pouch on side of unit) and the Cold Therapy Pad Fitting Instructions (provided with each Cold Therapy Pad) prior to use.

## 2024-03-03 MED ORDER — ORAL CARE MOUTH RINSE: 15.0000 mL | Freq: Once | OROMUCOSAL | Status: AC

## 2024-03-03 MED ORDER — TRANEXAMIC ACID-NACL 1000-0.7 MG/100ML-% IV SOLN
1000.0000 mg | INTRAVENOUS | Status: AC
  Administered 2024-03-04: 1000 mg via INTRAVENOUS

## 2024-03-03 MED ORDER — CHLORHEXIDINE GLUCONATE 0.12 % MT SOLN
15.0000 mL | Freq: Once | OROMUCOSAL | Status: AC
  Administered 2024-03-04: 15 mL via OROMUCOSAL

## 2024-03-03 MED ORDER — SODIUM CHLORIDE 0.9 % IV SOLN: INTRAVENOUS | Status: DC

## 2024-03-03 MED ORDER — CEFAZOLIN SODIUM-DEXTROSE 2-4 GM/100ML-% IV SOLN
2.0000 g | INTRAVENOUS | Status: AC
  Administered 2024-03-04: 3 g via INTRAVENOUS

## 2024-03-04 ENCOUNTER — Ambulatory Visit

## 2024-03-04 ENCOUNTER — Encounter: Payer: Self-pay | Admitting: Surgery

## 2024-03-04 ENCOUNTER — Encounter
Admission: RE | Disposition: A | Discharge status: Home / Self Care | Payer: Self-pay | Source: Home / Self Care | Attending: Surgery

## 2024-03-04 ENCOUNTER — Other Ambulatory Visit: Payer: Self-pay

## 2024-03-04 ENCOUNTER — Ambulatory Visit
Admission: RE | Admit: 2024-03-04 | Discharge: 2024-03-05 | Disposition: A | Discharge status: Home / Self Care | Attending: Surgery | Admitting: Surgery

## 2024-03-04 ENCOUNTER — Ambulatory Visit: Payer: Self-pay | Admitting: Urgent Care

## 2024-03-04 DIAGNOSIS — E119 Type 2 diabetes mellitus without complications: Secondary | ICD-10-CM | POA: Insufficient documentation

## 2024-03-04 DIAGNOSIS — Z01812 Encounter for preprocedural laboratory examination: Secondary | ICD-10-CM

## 2024-03-04 DIAGNOSIS — E66813 Obesity, class 3: Secondary | ICD-10-CM | POA: Diagnosis not present

## 2024-03-04 DIAGNOSIS — M23203 Derangement of unspecified medial meniscus due to old tear or injury, right knee: Secondary | ICD-10-CM | POA: Diagnosis not present

## 2024-03-04 DIAGNOSIS — Z6841 Body Mass Index (BMI) 40.0 and over, adult: Secondary | ICD-10-CM | POA: Insufficient documentation

## 2024-03-04 DIAGNOSIS — Z7901 Long term (current) use of anticoagulants: Secondary | ICD-10-CM | POA: Diagnosis not present

## 2024-03-04 DIAGNOSIS — Z7985 Long-term (current) use of injectable non-insulin antidiabetic drugs: Secondary | ICD-10-CM | POA: Diagnosis not present

## 2024-03-04 DIAGNOSIS — Z96651 Presence of right artificial knee joint: Secondary | ICD-10-CM

## 2024-03-04 DIAGNOSIS — Z7984 Long term (current) use of oral hypoglycemic drugs: Secondary | ICD-10-CM | POA: Insufficient documentation

## 2024-03-04 DIAGNOSIS — I10 Essential (primary) hypertension: Secondary | ICD-10-CM | POA: Insufficient documentation

## 2024-03-04 DIAGNOSIS — G4730 Sleep apnea, unspecified: Secondary | ICD-10-CM | POA: Diagnosis not present

## 2024-03-04 DIAGNOSIS — M1711 Unilateral primary osteoarthritis, right knee: Secondary | ICD-10-CM | POA: Insufficient documentation

## 2024-03-04 HISTORY — PX: TOTAL KNEE ARTHROPLASTY: SHX125

## 2024-03-04 LAB — GLUCOSE, CAPILLARY
Glucose-Capillary: 157 mg/dL — ABNORMAL HIGH (ref 70–99)
Glucose-Capillary: 172 mg/dL — ABNORMAL HIGH (ref 70–99)
Glucose-Capillary: 211 mg/dL — ABNORMAL HIGH (ref 70–99)
Glucose-Capillary: 232 mg/dL — ABNORMAL HIGH (ref 70–99)

## 2024-03-04 SURGERY — ARTHROPLASTY, KNEE, TOTAL
Anesthesia: Spinal | Site: Knee | Laterality: Right

## 2024-03-04 MED ORDER — FLEET ENEMA RE ENEM: 1.0000 | ENEMA | Freq: Once | RECTAL | Status: DC | PRN

## 2024-03-04 MED ORDER — INSULIN ASPART 100 UNIT/ML IJ SOLN
0.0000 [IU] | Freq: Three times a day (TID) | INTRAMUSCULAR | Status: DC
  Administered 2024-03-04: 7 [IU] via SUBCUTANEOUS
  Administered 2024-03-05: 4 [IU] via SUBCUTANEOUS
  Filled 2024-03-04 (×2): qty 1

## 2024-03-04 MED ORDER — BUPIVACAINE HCL (PF) 0.5 % IJ SOLN
INTRAMUSCULAR | Status: DC | PRN
Start: 2024-03-04 — End: 2024-03-04
  Administered 2024-03-04: 2.5 mL

## 2024-03-04 MED ORDER — SODIUM CHLORIDE 0.9 % IV SOLN
INTRAVENOUS | Status: DC | PRN
  Administered 2024-03-04: 60 mL

## 2024-03-04 MED ORDER — ONDANSETRON HCL 4 MG/2ML IJ SOLN
INTRAMUSCULAR | Status: DC | PRN
  Administered 2024-03-04: 4 mg via INTRAVENOUS

## 2024-03-04 MED ORDER — OXYCODONE HCL 5 MG PO TABS: 5.0000 mg | ORAL_TABLET | ORAL | 0 refills | Status: DC | PRN

## 2024-03-04 MED ORDER — SODIUM CHLORIDE 0.9 % IV SOLN: INTRAVENOUS | Status: DC

## 2024-03-04 MED ORDER — PHENYLEPHRINE 80 MCG/ML (10ML) SYRINGE FOR IV PUSH (FOR BLOOD PRESSURE SUPPORT)
PREFILLED_SYRINGE | INTRAVENOUS | Status: DC | PRN
  Administered 2024-03-04: 80 ug via INTRAVENOUS

## 2024-03-04 MED ORDER — ONDANSETRON 4 MG PO TBDP: 4.0000 mg | ORAL_TABLET | Freq: Three times a day (TID) | ORAL | 1 refills | Status: AC | PRN

## 2024-03-04 MED ORDER — MAGNESIUM HYDROXIDE 400 MG/5ML PO SUSP: 30.0000 mL | Freq: Every day | ORAL | Status: DC | PRN

## 2024-03-04 MED ORDER — MIDAZOLAM HCL 2 MG/2ML IJ SOLN
INTRAMUSCULAR | Status: AC
  Filled 2024-03-04: qty 2

## 2024-03-04 MED ORDER — PROPOFOL 10 MG/ML IV BOLUS
INTRAVENOUS | Status: DC | PRN
  Administered 2024-03-04: 200 mg via INTRAVENOUS
  Administered 2024-03-04: 40 mg via INTRAVENOUS
  Administered 2024-03-04: 50 ug/kg/min via INTRAVENOUS

## 2024-03-04 MED ORDER — ONDANSETRON HCL 4 MG/2ML IJ SOLN: 4.0000 mg | Freq: Four times a day (QID) | INTRAMUSCULAR | Status: DC | PRN

## 2024-03-04 MED ORDER — METOCLOPRAMIDE HCL 10 MG PO TABS: 5.0000 mg | ORAL_TABLET | Freq: Three times a day (TID) | ORAL | Status: DC | PRN

## 2024-03-04 MED ORDER — KETOROLAC TROMETHAMINE 15 MG/ML IJ SOLN
7.5000 mg | Freq: Four times a day (QID) | INTRAMUSCULAR | Status: DC
  Administered 2024-03-04 – 2024-03-05 (×3): 7.5 mg via INTRAVENOUS
  Filled 2024-03-04 (×2): qty 1

## 2024-03-04 MED ORDER — VITAMIN B-12 1000 MCG PO TABS
1000.0000 ug | ORAL_TABLET | Freq: Every day | ORAL | Status: DC
  Administered 2024-03-05: 1000 ug via ORAL
  Filled 2024-03-04: qty 1

## 2024-03-04 MED ORDER — DOCUSATE SODIUM 100 MG PO CAPS
100.0000 mg | ORAL_CAPSULE | Freq: Two times a day (BID) | ORAL | Status: DC
  Administered 2024-03-04 – 2024-03-05 (×2): 100 mg via ORAL
  Filled 2024-03-04 (×2): qty 1

## 2024-03-04 MED ORDER — BUPIVACAINE-EPINEPHRINE (PF) 0.5% -1:200000 IJ SOLN
INTRAMUSCULAR | Status: AC
  Filled 2024-03-04: qty 30

## 2024-03-04 MED ORDER — APIXABAN 2.5 MG PO TABS: 2.5000 mg | ORAL_TABLET | Freq: Two times a day (BID) | ORAL | 0 refills | Status: DC

## 2024-03-04 MED ORDER — 0.9 % SODIUM CHLORIDE (POUR BTL) OPTIME
TOPICAL | Status: DC | PRN
  Administered 2024-03-04: 500 mL

## 2024-03-04 MED ORDER — CHLORHEXIDINE GLUCONATE 4 % EX SOLN: 1.0000 | CUTANEOUS | 1 refills | Status: DC

## 2024-03-04 MED ORDER — FENTANYL CITRATE (PF) 100 MCG/2ML IJ SOLN: 25.0000 ug | INTRAMUSCULAR | Status: DC | PRN

## 2024-03-04 MED ORDER — SODIUM CHLORIDE 0.9 % BOLUS PEDS
250.0000 mL | Freq: Once | INTRAVENOUS | Status: AC
  Administered 2024-03-04: 250 mL via INTRAVENOUS

## 2024-03-04 MED ORDER — DIPHENHYDRAMINE HCL 12.5 MG/5ML PO ELIX: 12.5000 mg | ORAL_SOLUTION | ORAL | Status: DC | PRN

## 2024-03-04 MED ORDER — FENTANYL CITRATE (PF) 100 MCG/2ML IJ SOLN
INTRAMUSCULAR | Status: DC | PRN
  Administered 2024-03-04: 50 ug via INTRAVENOUS
  Administered 2024-03-04: 25 ug via INTRAVENOUS

## 2024-03-04 MED ORDER — ACETAMINOPHEN 500 MG PO TABS
1000.0000 mg | ORAL_TABLET | Freq: Four times a day (QID) | ORAL | Status: DC
  Administered 2024-03-04 – 2024-03-05 (×3): 1000 mg via ORAL
  Filled 2024-03-04 (×3): qty 2

## 2024-03-04 MED ORDER — ROCURONIUM BROMIDE 100 MG/10ML IV SOLN
INTRAVENOUS | Status: DC | PRN
  Administered 2024-03-04: 50 mg via INTRAVENOUS
  Administered 2024-03-04: 20 mg via INTRAVENOUS

## 2024-03-04 MED ORDER — TRIAMCINOLONE ACETONIDE 40 MG/ML IJ SUSP
INTRAMUSCULAR | Status: AC
  Filled 2024-03-04: qty 2

## 2024-03-04 MED ORDER — CEFAZOLIN SODIUM-DEXTROSE 2-4 GM/100ML-% IV SOLN
INTRAVENOUS | Status: AC
Start: 2024-03-04 — End: 2024-03-04
  Filled 2024-03-04: qty 100

## 2024-03-04 MED ORDER — ACETAMINOPHEN 325 MG PO TABS: 325.0000 mg | ORAL_TABLET | Freq: Four times a day (QID) | ORAL | Status: DC | PRN

## 2024-03-04 MED ORDER — DROPERIDOL 2.5 MG/ML IJ SOLN
INTRAMUSCULAR | Status: AC
  Filled 2024-03-04: qty 2

## 2024-03-04 MED ORDER — LIDOCAINE HCL (CARDIAC) PF 100 MG/5ML IV SOSY
PREFILLED_SYRINGE | INTRAVENOUS | Status: DC | PRN
  Administered 2024-03-04: 80 mg via INTRAVENOUS

## 2024-03-04 MED ORDER — BUPIVACAINE LIPOSOME 1.3 % IJ SUSP
INTRAMUSCULAR | Status: AC
  Filled 2024-03-04: qty 20

## 2024-03-04 MED ORDER — KETOROLAC TROMETHAMINE 15 MG/ML IJ SOLN
INTRAMUSCULAR | Status: AC
Start: 2024-03-04 — End: 2024-03-04
  Filled 2024-03-04: qty 1

## 2024-03-04 MED ORDER — SODIUM CHLORIDE 0.9 % IV SOLN
INTRAVENOUS | Status: DC
Start: 2024-03-04 — End: 2024-03-04

## 2024-03-04 MED ORDER — SODIUM CHLORIDE 0.9 % IR SOLN
Status: DC | PRN
  Administered 2024-03-04: 3000 mL

## 2024-03-04 MED ORDER — ACETAMINOPHEN 10 MG/ML IV SOLN
INTRAVENOUS | Status: AC
  Filled 2024-03-04: qty 100

## 2024-03-04 MED ORDER — TRANEXAMIC ACID-NACL 1000-0.7 MG/100ML-% IV SOLN
INTRAVENOUS | Status: AC
  Filled 2024-03-04: qty 100

## 2024-03-04 MED ORDER — BISACODYL 10 MG RE SUPP: 10.0000 mg | Freq: Every day | RECTAL | Status: DC | PRN

## 2024-03-04 MED ORDER — METOCLOPRAMIDE HCL 5 MG/ML IJ SOLN: 5.0000 mg | Freq: Three times a day (TID) | INTRAMUSCULAR | Status: DC | PRN

## 2024-03-04 MED ORDER — APIXABAN 2.5 MG PO TABS
2.5000 mg | ORAL_TABLET | Freq: Two times a day (BID) | ORAL | Status: DC
  Administered 2024-03-05: 2.5 mg via ORAL
  Filled 2024-03-04: qty 1

## 2024-03-04 MED ORDER — SIMVASTATIN 20 MG PO TABS
20.0000 mg | ORAL_TABLET | Freq: Every day | ORAL | Status: DC
  Administered 2024-03-04: 20 mg via ORAL
  Filled 2024-03-04: qty 1

## 2024-03-04 MED ORDER — KETOROLAC TROMETHAMINE 30 MG/ML IJ SOLN
INTRAMUSCULAR | Status: DC | PRN
  Administered 2024-03-04: 30 mg via INTRAMUSCULAR

## 2024-03-04 MED ORDER — TRIAMCINOLONE ACETONIDE 40 MG/ML IJ SUSP
INTRAMUSCULAR | Status: DC | PRN
Start: 2024-03-04 — End: 2024-03-04
  Administered 2024-03-04: 80 mg via INTRAMUSCULAR

## 2024-03-04 MED ORDER — MUPIROCIN 2 % EX OINT: 1.0000 | TOPICAL_OINTMENT | Freq: Two times a day (BID) | CUTANEOUS | 0 refills | Status: DC

## 2024-03-04 MED ORDER — SODIUM CHLORIDE (PF) 0.9 % IJ SOLN
INTRAMUSCULAR | Status: AC
  Filled 2024-03-04: qty 40

## 2024-03-04 MED ORDER — OXYCODONE HCL 5 MG PO TABS: 5.0000 mg | ORAL_TABLET | ORAL | Status: DC | PRN

## 2024-03-04 MED ORDER — BUPIVACAINE HCL (PF) 0.5 % IJ SOLN
INTRAMUSCULAR | Status: AC
  Filled 2024-03-04: qty 10

## 2024-03-04 MED ORDER — PROPOFOL 1000 MG/100ML IV EMUL
INTRAVENOUS | Status: AC
Start: 2024-03-04 — End: 2024-03-04
  Filled 2024-03-04: qty 100

## 2024-03-04 MED ORDER — OXYCODONE HCL 5 MG PO TABS
ORAL_TABLET | ORAL | Status: AC
  Filled 2024-03-04: qty 1

## 2024-03-04 MED ORDER — CHLORHEXIDINE GLUCONATE 0.12 % MT SOLN
OROMUCOSAL | Status: AC
  Filled 2024-03-04: qty 15

## 2024-03-04 MED ORDER — SUCCINYLCHOLINE CHLORIDE 200 MG/10ML IV SOSY
PREFILLED_SYRINGE | INTRAVENOUS | Status: DC | PRN
  Administered 2024-03-04: 160 mg via INTRAVENOUS

## 2024-03-04 MED ORDER — MIDAZOLAM HCL 5 MG/5ML IJ SOLN
INTRAMUSCULAR | Status: DC | PRN
  Administered 2024-03-04: 2 mg via INTRAVENOUS

## 2024-03-04 MED ORDER — DROPERIDOL 2.5 MG/ML IJ SOLN
0.6250 mg | Freq: Once | INTRAMUSCULAR | Status: AC
  Administered 2024-03-04: 0.625 mg via INTRAVENOUS

## 2024-03-04 MED ORDER — SUMATRIPTAN SUCCINATE 50 MG PO TABS
50.0000 mg | ORAL_TABLET | Freq: Three times a day (TID) | ORAL | Status: DC | PRN
  Administered 2024-03-04: 50 mg via ORAL
  Filled 2024-03-04 (×2): qty 1

## 2024-03-04 MED ORDER — SODIUM CHLORIDE 0.9 % BOLUS PEDS: 250.0000 mL | Freq: Once | INTRAVENOUS | Status: DC

## 2024-03-04 MED ORDER — PHENYLEPHRINE HCL-NACL 20-0.9 MG/250ML-% IV SOLN
INTRAVENOUS | Status: DC | PRN
  Administered 2024-03-04: 40 ug/min via INTRAVENOUS

## 2024-03-04 MED ORDER — DEXAMETHASONE SODIUM PHOSPHATE 10 MG/ML IJ SOLN
INTRAMUSCULAR | Status: DC | PRN
  Administered 2024-03-04: 10 mg via INTRAVENOUS

## 2024-03-04 MED ORDER — METFORMIN HCL ER 500 MG PO TB24
1000.0000 mg | ORAL_TABLET | Freq: Every day | ORAL | Status: DC
  Administered 2024-03-04: 1000 mg via ORAL

## 2024-03-04 MED ORDER — PROPOFOL 1000 MG/100ML IV EMUL
INTRAVENOUS | Status: AC
  Filled 2024-03-04: qty 100

## 2024-03-04 MED ORDER — VERAPAMIL HCL ER 240 MG PO TBCR
240.0000 mg | EXTENDED_RELEASE_TABLET | Freq: Every evening | ORAL | Status: DC
  Administered 2024-03-04: 240 mg via ORAL
  Filled 2024-03-04 (×3): qty 1

## 2024-03-04 MED ORDER — GLIPIZIDE ER 2.5 MG PO TB24
2.5000 mg | ORAL_TABLET | Freq: Every day | ORAL | Status: DC
Start: 2024-03-05 — End: 2024-03-05
  Administered 2024-03-05: 2.5 mg via ORAL
  Filled 2024-03-04: qty 1

## 2024-03-04 MED ORDER — FENTANYL CITRATE (PF) 100 MCG/2ML IJ SOLN
INTRAMUSCULAR | Status: AC
Start: 2024-03-04 — End: 2024-03-04
  Filled 2024-03-04: qty 2

## 2024-03-04 MED ORDER — VITAMIN D 25 MCG (1000 UNIT) PO TABS
1000.0000 [IU] | ORAL_TABLET | Freq: Every day | ORAL | Status: DC
  Administered 2024-03-05: 1000 [IU] via ORAL
  Filled 2024-03-04: qty 1

## 2024-03-04 MED ORDER — KETOROLAC TROMETHAMINE 15 MG/ML IJ SOLN
15.0000 mg | Freq: Once | INTRAMUSCULAR | Status: AC
  Administered 2024-03-04: 15 mg via INTRAVENOUS

## 2024-03-04 MED ORDER — BUPIVACAINE-EPINEPHRINE (PF) 0.5% -1:200000 IJ SOLN
INTRAMUSCULAR | Status: DC | PRN
  Administered 2024-03-04: 30 mL

## 2024-03-04 MED ORDER — PROPOFOL 10 MG/ML IV BOLUS
INTRAVENOUS | Status: AC
  Filled 2024-03-04: qty 20

## 2024-03-04 MED ORDER — ACETAMINOPHEN 10 MG/ML IV SOLN
INTRAVENOUS | Status: DC | PRN
  Administered 2024-03-04: 1000 mg via INTRAVENOUS

## 2024-03-04 MED ORDER — LAMOTRIGINE ER 100 MG PO TB24
100.0000 mg | ORAL_TABLET | Freq: Every day | ORAL | Status: DC
  Administered 2024-03-04: 100 mg via ORAL
  Filled 2024-03-04: qty 1

## 2024-03-04 MED ORDER — OXYCODONE HCL 5 MG PO TABS
5.0000 mg | ORAL_TABLET | Freq: Once | ORAL | Status: AC | PRN
  Administered 2024-03-04: 5 mg via ORAL

## 2024-03-04 MED ORDER — ONDANSETRON HCL 4 MG PO TABS: 4.0000 mg | ORAL_TABLET | Freq: Four times a day (QID) | ORAL | Status: DC | PRN

## 2024-03-04 MED ORDER — CEFAZOLIN SODIUM-DEXTROSE 3-4 GM/150ML-% IV SOLN
3.0000 g | Freq: Four times a day (QID) | INTRAVENOUS | Status: AC
  Administered 2024-03-04 (×2): 3 g via INTRAVENOUS
  Filled 2024-03-04 (×2): qty 150

## 2024-03-04 MED ORDER — OXYCODONE HCL 5 MG/5ML PO SOLN: 5.0000 mg | Freq: Once | ORAL | Status: AC | PRN

## 2024-03-04 MED ORDER — SUGAMMADEX SODIUM 200 MG/2ML IV SOLN
INTRAVENOUS | Status: DC | PRN
  Administered 2024-03-04: 350 mg via INTRAVENOUS

## 2024-03-04 MED ORDER — PROPOFOL 10 MG/ML IV BOLUS
INTRAVENOUS | Status: AC
  Filled 2024-03-04: qty 40

## 2024-03-04 MED ORDER — METFORMIN HCL ER 500 MG PO TB24
ORAL_TABLET | ORAL | Status: AC
  Filled 2024-03-04: qty 2

## 2024-03-04 MED ORDER — SUCCINYLCHOLINE CHLORIDE 200 MG/10ML IV SOSY
PREFILLED_SYRINGE | INTRAVENOUS | Status: AC
  Filled 2024-03-04: qty 10

## 2024-03-04 SURGICAL SUPPLY — 52 items
BLADE SAW 90X13X1.19 OSCILLAT (BLADE) ×1 IMPLANT
BLADE SAW SAG 25X90X1.19 (BLADE) ×1 IMPLANT
BLADE SURG SZ20 CARB STEEL (BLADE) ×1 IMPLANT
BNDG COMPR 6X5.8 VLCR NS LF (GAUZE/BANDAGES/DRESSINGS) ×1 IMPLANT
CEMENT VACUUM MIXING SYSTEM (MISCELLANEOUS) ×1 IMPLANT
CHLORAPREP W/TINT 26 (MISCELLANEOUS) ×1 IMPLANT
COMPONENT FEM CMT PS STD 11 RT (Joint) IMPLANT
COMPONENT PATELLA PEG 3 32 (Joint) IMPLANT
COMPONENT TIB KNEE PS G 0 RT (Joint) IMPLANT
COOLER ICEMAN CLASSIC (MISCELLANEOUS) ×1 IMPLANT
COVER MAYO STAND STRL (DRAPES) ×1 IMPLANT
CUFF TRNQT CYL 24X4X16.5-23 (TOURNIQUET CUFF) IMPLANT
CUFF TRNQT CYL 34X4.125X (TOURNIQUET CUFF) IMPLANT
DRAPE IMP U-DRAPE 54X76 (DRAPES) ×1 IMPLANT
DRAPE SHEET LG 3/4 BI-LAMINATE (DRAPES) ×1 IMPLANT
DRAPE U-SHAPE 47X51 STRL (DRAPES) ×1 IMPLANT
DRSG MEPILEX SACRM 8.7X9.8 (GAUZE/BANDAGES/DRESSINGS) IMPLANT
DRSG OPSITE POSTOP 4X10 (GAUZE/BANDAGES/DRESSINGS) ×1 IMPLANT
DRSG OPSITE POSTOP 4X8 (GAUZE/BANDAGES/DRESSINGS) ×1 IMPLANT
ELECT CAUTERY BLADE 6.4 (BLADE) ×1 IMPLANT
ELECTRODE REM PT RTRN 9FT ADLT (ELECTROSURGICAL) ×1 IMPLANT
GAUZE XEROFORM 1X8 LF (GAUZE/BANDAGES/DRESSINGS) ×1 IMPLANT
GLOVE BIO SURGEON STRL SZ7.5 (GLOVE) ×4 IMPLANT
GLOVE BIO SURGEON STRL SZ8 (GLOVE) ×4 IMPLANT
GLOVE BIOGEL PI IND STRL 8 (GLOVE) ×1 IMPLANT
GLOVE INDICATOR 8.0 STRL GRN (GLOVE) ×1 IMPLANT
GOWN STRL REUS W/ TWL LRG LVL3 (GOWN DISPOSABLE) ×1 IMPLANT
GOWN STRL REUS W/ TWL XL LVL3 (GOWN DISPOSABLE) ×1 IMPLANT
HOOD PEEL AWAY T7 (MISCELLANEOUS) ×3 IMPLANT
INSERT TIBIAL PERSONA 10 RT (Insert) IMPLANT
KIT TURNOVER KIT A (KITS) ×1 IMPLANT
MANIFOLD NEPTUNE II (INSTRUMENTS) ×1 IMPLANT
NDL SPNL 20GX3.5 QUINCKE YW (NEEDLE) ×1 IMPLANT
NEEDLE SPNL 20GX3.5 QUINCKE YW (NEEDLE) ×1 IMPLANT
NS IRRIG 500ML POUR BTL (IV SOLUTION) ×1 IMPLANT
PACK TOTAL KNEE (MISCELLANEOUS) ×1 IMPLANT
PAD COLD UNI WRAP-ON (PAD) ×1 IMPLANT
PENCIL SMOKE EVACUATOR (MISCELLANEOUS) ×1 IMPLANT
PIN DRILL HDLS TROCAR 75 4PK (PIN) IMPLANT
SCREW FEMALE HEX FIX 25X2.5 (ORTHOPEDIC DISPOSABLE SUPPLIES) IMPLANT
SOL .9 NS 3000ML IRR UROMATIC (IV SOLUTION) ×1 IMPLANT
STAPLER SKIN PROX 35W (STAPLE) ×1 IMPLANT
STOCKINETTE IMPERV 14X48 (MISCELLANEOUS) ×1 IMPLANT
SUCTION TUBE FRAZIER 10FR DISP (SUCTIONS) ×1 IMPLANT
SUT VIC AB 0 CT1 36 (SUTURE) ×3 IMPLANT
SUT VIC AB 2-0 CT1 TAPERPNT 27 (SUTURE) ×3 IMPLANT
SYR 10ML LL (SYRINGE) ×1 IMPLANT
SYR 20ML LL LF (SYRINGE) ×1 IMPLANT
SYR 30ML LL (SYRINGE) IMPLANT
TIP FAN IRRIG PULSAVAC PLUS (DISPOSABLE) ×1 IMPLANT
TRAP FLUID SMOKE EVACUATOR (MISCELLANEOUS) ×1 IMPLANT
WATER STERILE IRR 500ML POUR (IV SOLUTION) ×1 IMPLANT

## 2024-03-04 NOTE — Anesthesia Preprocedure Evaluation (Signed)
 Anesthesia Evaluation  Patient identified by MRN, date of birth, ID band Patient awake    Reviewed: Allergy & Precautions, NPO status , Patient's Chart, lab work & pertinent test results  Airway Mallampati: IV  TM Distance: >3 FB Neck ROM: full    Dental  (+) Chipped   Pulmonary shortness of breath and with exertion, asthma , sleep apnea and Continuous Positive Airway Pressure Ventilation , former smoker   Pulmonary exam normal        Cardiovascular hypertension, negative cardio ROS Normal cardiovascular exam     Neuro/Psych  PSYCHIATRIC DISORDERS Anxiety     negative neurological ROS     GI/Hepatic negative GI ROS, Neg liver ROS,,,  Endo/Other  diabetes  Class 3 obesity  Renal/GU      Musculoskeletal   Abdominal   Peds  Hematology negative hematology ROS (+)   Anesthesia Other Findings Past Medical History: No date: Anemia No date: Anxiety No date: Asthma No date: Cellulitis No date: Diabetes mellitus without complication (HCC) No date: Dyspnea No date: Headache No date: HTN (hypertension) No date: Obesity No date: Pneumonia No date: Primary osteoarthritis of right knee No date: Pyogenic granuloma of skin No date: Sleep apnea  Past Surgical History: 1994: COLON SURGERY     Comment:  Diverticulitis 10/01/2019: COLONOSCOPY WITH PROPOFOL ; N/A     Comment:  Procedure: COLONOSCOPY WITH PROPOFOL ;  Surgeon: Dessa Reyes ORN, MD;  Location: [facility] ENDOSCOPY;  Service:               Endoscopy;  Laterality: N/A; 2009: HYDROCELE EXCISION / REPAIR 2009: VASECTOMY  BMI    Body Mass Index: 47.39 kg/m      Reproductive/Obstetrics negative OB ROS                              Anesthesia Physical Anesthesia Plan  ASA: 3  Anesthesia Plan: Spinal   Post-op Pain Management:    Induction: Intravenous  PONV Risk Score and Plan: 2 and TIVA, Propofol  infusion, Midazolam ,  Dexamethasone  and Ondansetron   Airway Management Planned: Natural Airway and Nasal Cannula  Additional Equipment:   Intra-op Plan:   Post-operative Plan:   Informed Consent: I have reviewed the patients History and Physical, chart, labs and discussed the procedure including the risks, benefits and alternatives for the proposed anesthesia with the patient or authorized representative who has indicated his/her understanding and acceptance.     Dental Advisory Given  Plan Discussed with: Anesthesiologist, CRNA and Surgeon  Anesthesia Plan Comments: (Patient reports no bleeding problems and no anticoagulant use.  Plan for spinal with backup GA  Patient consented for risks of anesthesia including but not limited to:  - adverse reactions to medications - damage to eyes, teeth, lips or other oral mucosa - nerve damage due to positioning  - risk of bleeding, infection and or nerve damage from spinal that could lead to paralysis - risk of headache or failed spinal - damage to teeth, lips or other oral mucosa - sore throat or hoarseness - damage to heart, brain, nerves, lungs, other parts of body or loss of life  Patient voiced understanding and assent.)        Anesthesia Quick Evaluation

## 2024-03-04 NOTE — H&P (Signed)
 History of Present Illness: Mathew Porter is a 68 y.o. male who presents today for his surgical history and physical for upcoming right total knee arthroplasty scheduled with Dr. Edie on 03/04/2024. The patient denies any changes to his medical history since he was last evaluated. He denies any numbness or ting to the right lower extremity at today's visit. The patient has not suffered any recent trauma or falls affecting the right knee. He reports mild discomfort in the right knee at today's visit. He denies any personal history of heart attack or stroke. Denies any history of asthma or COPD. The patient denies any history of DVT. The patient is a diabetic, most recent A1c was 6.9.  Past Medical History: ADD (attention deficit disorder)  Anxiety  Chickenpox  Colon polyp  Diabetes mellitus without complication (CMS/HHS-HCC)  Diverticulosis  Headache  Hypertension   Past Surgical History: Colon resection for diverticulitis 1991 (sigmoid colon resection)  COLONOSCOPY 08/26/2002 (Dr. Reeta - Hyperplastic Polyp (7mm))  Tdap 02/2011  COLONOSCOPY 10/01/2019 (Diverticulosis/PHx CP/Repeat 44yrs/JWB)  COLONOSCOPY 12/01/2013, 01/21/1990 (Dr. Gaylyn @ [facility] - PH Adenomatous Polyp, 5 yrs per [facility])  Wisdom Teeth Removed   Past Family History: Myocardial Infarction (Heart attack) Father  Alcohol abuse Father   Medications: aspirin -acetaminophen -caffeine  (EXCEDRIN  MIGRAINE) 250-250-65 mg per tablet Take 2 tablets by mouth every 6 (six) hours as needed for Pain.  cholecalciferol  (VITAMIN D3) 1000 unit capsule Take 1,000 Units by mouth once daily  glipiZIDE  (GLUCOTROL ) 2.5 MG XL tablet Take 1 tablet by mouth daily. 90 tablet 3  ibuprofen (MOTRIN) 200 MG tablet Take 200 mg by mouth every 6 (six) hours as needed for Pain  lamoTRIgine  (LAMICTAL  XR) 100 mg XR tablet TAKE 1 TABLET(100 MG) BY MOUTH AT BEDTIME 90 tablet 3  metFORMIN  (GLUCOPHAGE -XR) 500 MG XR tablet TAKE 2 TABLETS(1000 MG) BY MOUTH DAILY WITH  DINNER 180 tablet 3  simvastatin  (ZOCOR ) 20 MG tablet TAKE 1 TABLET(20 MG) BY MOUTH EVERY NIGHT 90 tablet 3  SUMAtriptan  (IMITREX ) 50 MG tablet Take 1 tablet by mouth as directed for migraine. May take a second dose after 2 hours if needed. 10 tablet 1  tirzepatide (MOUNJARO) 10 mg/0.5 mL pen injector Inject 0.5 mLs (10 mg total) subcutaneously once a week 6 mL 1  verapamiL  (CALAN -SR) 240 MG SR tablet TAKE 1 TABLET(240 MG) BY MOUTH EVERY DAY 90 tablet 3   Allergies: Doxycycline  Itching   Review of Systems:  A comprehensive 14 point ROS was performed, reviewed by me today, and the pertinent orthopaedic findings are documented in the HPI.  Physical Exam: BP 136/80  Ht 175.3 cm (5' 9)  Wt (!) 156.5 kg (345 lb)  BMI 50.95 kg/m  General/Constitutional: The patient appears to be well-nourished, well-developed, and in no acute distress. Neuro/Psych: Normal mood and affect, oriented to person, place and time. Eyes: Non-icteric. Pupils are equal, round, and reactive to light, and exhibit synchronous movement. ENT: Unremarkable. Lymphatic: No palpable adenopathy. Respiratory: Lungs clear to auscultation, Normal chest excursion, No wheezes, and Non-labored breathing Cardiovascular: Regular rate and rhythm. No murmurs. and No edema, swelling or tenderness, except as noted in detailed exam. Integumentary: No impressive skin lesions present, except as noted in detailed exam. Musculoskeletal: Unremarkable, except as noted in detailed exam.  Right knee exam: GAIT: antalgic and uses a cane. ALIGNMENT: mild varus SKIN: unremarkable SWELLING: minimal EFFUSION: trace WARMTH: no warmth TENDERNESS: moderate over the medial joint line ROM: 0 to 125 degrees with pain in maximal flexion McMURRAY'S: positive PATELLOFEMORAL:  normal tracking with no peri-patellar tenderness and negative apprehension sign CREPITUS: no LACHMAN'S: negative PIVOT SHIFT: Not evaluated ANTERIOR DRAWER: negative POSTERIOR  DRAWER: negative VARUS/VALGUS: positive pseudolaxity to varus stressing  He is grossly neurovascularly intact of the right lower extremity and foot, although he does exhibit some chronic venous stasis changes of the right ankle and lower leg.  Imaging: AP weightbearing of both knees, as well as lateral and merchant views of the right knee are obtained. These films demonstrate severe degenerative changes, primarily involving the medial compartment with 90% Medial joint space narrowing. Overall alignment is mild varus. Moderate osteoarthritic changes noted to the right patella with superior and inferior spurring noted. Decreased space along the patellofemoral compartment. No fractures, lytic lesions, or abnormal calcifications are noted.  Knee Imaging, external: Right knee: A recent MRI scan of the right knee also is available for review and has been reviewed by myself. By report, the study demonstrates evidence of a chronic radial tear of the posterior portion of the medial meniscus with extrusion of the meniscus. Moderate degenerative changes of the medial compartment as well as of the medial portion of the patellofemoral compartment are observed, as are areas of bone marrow edema involving both the medial femoral condyle and medial tibial plateau with findings concerning for a possible insufficiency fracture of the medial tibial plateau.  Impression: 1. Primary osteoarthritis of right knee. 2. Old complex tear of medial meniscus of right knee. 3. Class 3 severe obesity due to excess calories.  Plan:  1. Treatment options were discussed today with the patient. 2. The patient is scheduled for a right total knee arthroplasty with Dr. Edie on 03/04/2024. 3. The patient was instructed on the risk and benefits of surgical intervention at today's visit and wishes to proceed at this time. We did discuss that because of his chronic venous stasis changes and obesity there is more risk of complications with  wound healing and infection, after discussion this he still wishes to proceed. 4. This document will serve as a surgical history and physical for his upcoming surgery. 5. The patient will follow-up per standard postop protocol. They can call the clinic they have any questions, new symptoms develop or symptoms worsen.  The procedure was discussed with the patient, as were the potential risks (including bleeding, infection, nerve and/or blood vessel injury, persistent or recurrent pain, failure of the hardware, stiffness, amputation, need for further surgery, blood clots, strokes, heart attacks and/or arhythmias, pneumonia, etc.) and benefits. The patient states his understanding and wishes to proceed.    H&P reviewed and patient re-examined. No changes.

## 2024-03-04 NOTE — Anesthesia Procedure Notes (Signed)
 Spinal  Patient location during procedure: OR Start time: 03/04/2024 7:33 AM End time: 03/04/2024 7:43 AM Reason for block: surgical anesthesia Staffing Performed by: Viviana Lacks, RN Authorized by: Leavy Ned, MD   Preanesthetic Checklist Completed: patient identified, IV checked, site marked, risks and benefits discussed, surgical consent, monitors and equipment checked, pre-op evaluation and timeout performed Spinal Block Patient position: sitting Prep: Betadine Patient monitoring: heart rate, continuous pulse ox, blood pressure and cardiac monitor Approach: midline Location: L4-5 Injection technique: single-shot Needle Needle type: Introducer and Pencan  Needle gauge: 24 G Needle length: 9 cm Assessment Events: CSF return Additional Notes Negative paresthesia. Negative blood return. Positive free-flowing CSF. Expiration date of kit checked and confirmed. Patient tolerated procedure well, without complications.

## 2024-03-04 NOTE — Progress Notes (Signed)
 This patient is not able to walk the distance required to go the bathroom, or he is unable to safely negotiate stairs required to access the bathroom.  A bariatric 3-in-1 BSC will alleviate this problem.

## 2024-03-04 NOTE — Transfer of Care (Signed)
 Immediate Anesthesia Transfer of Care Note  Patient: Mathew Porter  Procedure(s) Performed: ARTHROPLASTY, KNEE, TOTAL (Right: Knee)  Patient Location: PACU  Anesthesia Type:General  Level of Consciousness: drowsy  Airway & Oxygen Therapy: Patient Spontanous Breathing and Patient connected to face mask oxygen  Post-op Assessment: Report given to RN and Post -op Vital signs reviewed and stable  Post vital signs: Reviewed and stable  Last Vitals:  Vitals Value Taken Time  BP 124/79 03/04/24 10:05  Temp 36.3 C 03/04/24 10:05  Pulse 81 03/04/24 10:06  Resp 19 03/04/24 10:06  SpO2 93 % 03/04/24 10:06  Vitals shown include unfiled device data.  Last Pain:  Vitals:   03/04/24 0617  TempSrc: Temporal  PainSc: 7          Complications: No notable events documented.

## 2024-03-04 NOTE — Discharge Instructions (Addendum)
 Orthopedic discharge instructions: May shower with intact OpSite dressing. Apply ice frequently to knee or use Polar Care. Start Eliquis  1 tablet (2.5 mg) twice daily on Wednesday, 03/05/2024, for 2 weeks, then take aspirin  325 mg twice daily for 4 weeks. Take pain medication as prescribed when needed.  May supplement with ES Tylenol  if necessary. May weight-bear as tolerated on right leg - use walker for balance and support. Follow-up in 10-14 days or as scheduled.

## 2024-03-04 NOTE — Op Note (Signed)
 03/04/2024  9:55 AM  Patient:   Mathew Porter  Pre-Op Diagnosis:   Degenerative joint disease, right knee.  Post-Op Diagnosis:   Same  Procedure:   Right TKA using all-pressfit Zimmer Persona system with a #11 PCR femur, a(n) G-sized  tibial tray with a 10 mm medial congruent E-poly insert, and a 9 x 32 mm all-poly 3-pegged domed patella.  Surgeon:   DOROTHA Reyes Maltos, MD  Assistant:   Gustavo Level, PA-C   Anesthesia:   Spinal --> GET  Findings:   As above  Complications:   None  EBL:   20 cc  Fluids:   600 cc crystalloid  UOP:   None  TT:   80 minutes at 325 mmHg  Drains:   None  Closure:   Staples  Implants:   As above  Brief Clinical Note:   The patient is a 68 year old male with a long history of progressively worsening right knee pain. The patient's symptoms have progressed despite medications, activity modification, injections, etc. The patient's history and examination were consistent with advanced degenerative joint disease of the right knee confirmed by plain radiographs. The patient presents at this time for a right total knee arthroplasty.  Procedure:   The patient was brought into the operating room. After adequate spinal anesthesia was obtained, the patient was repositioned in the supine position on the operating room table. However, the patient continued to experience nausea. Given his size, the anesthesia team elected to proceed with general endotracheal intubation and anesthesia to better control his airway during the surgery. The right lower extremity was prepped with ChloraPrep solution and draped sterilely. Preoperative antibiotics were administered. A timeout was performed to verify the appropriate surgical site before the limb was exsanguinated with an Esmarch and the tourniquet inflated to 325 mmHg.   A standard anterior approach to the knee was made through an approximately 6-7 inch incision. The incision was carried down through the subcutaneous  tissues to expose superficial retinaculum. This was split the length of the incision and the medial flap elevated sufficiently to expose the medial retinaculum. The medial retinaculum was incised, leaving a 3-4 mm cuff of tissue on the patella. This was extended distally along the medial border of the patellar tendon and proximally through the medial third of the quadriceps tendon. A subtotal fat pad excision was performed before the soft tissues were elevated off the anteromedial and anterolateral aspects of the proximal tibia to the level of the collateral ligaments. The anterior portions of the medial and lateral menisci were removed, as was the anterior cruciate ligament. With the knee flexed to 90, the external tibial guide was positioned and the appropriate proximal tibial cut made. This piece was taken to the back table where it was measured and found to be optimally replicated by a(n) G-sized component.  Attention was directed to the distal femur. The intramedullary canal was accessed through a 3/8 drill hole. The intramedullary guide was inserted and placed at 5 of valgus alignment. Using the +0 slot, the distal cut was made. The distal femur was measured and found to be optimally replicated by the #11 component. The #11 4-in-1 cutting block was positioned and first the posterior, then the posterior chamfer, the anterior, and finally the anterior chamfer cuts were made after verifying that the anterior cortex would not be notched.   At this point, the posterior portions medial and lateral menisci were removed. A trial reduction was performed using the appropriate femoral and tibial components  with the 10 mm insert. This demonstrated excellent stability to varus and valgus stressing both in flexion and extension while permitting full extension. Patellar tracking was assessed and found to be excellent. The tibial trial position was marked on the proximal tibia. The patella thickness was measured and  found to be 20 mm, so the appropriate cut was made. The patellar surface was measured and found to be optimally replicated by the 32 mm component. The three peg holes were drilled in place before the trial button was inserted. Patella tracking was assessed and found to be excellent, passing the no thumb test. The lug holes were drilled into the distal femur before the trial component was removed.  The tibial tray was repositioned before the keel was created using the appropriate tower, drills, and punch.  The bony surfaces were prepared for implantation by irrigating them thoroughly with sterile saline solution via the jet lavage system. A bone plug was fashioned from some of the bone that had been removed previously and used to plug the distal femoral canal. In addition, a cocktail of 20 cc of Exparel , 30 cc of 0.5% Sensorcaine , 2 cc of Kenalog  40 (80 mg), and 30 mg of Toradol  diluted out to 90 cc with normal saline was injected into the postero-medial and postero-lateral aspects of the knee, the medial and lateral gutter regions, and the peri-incisional tissues to help with postoperative analgesia.    The tibial tray was impacted into place first with care taken to be sure the component was fully seated. Next, the femoral component was impacted into place again with care taken to be sure that the component was fully improperly seated. The permanent 10 mm medial congruent E-polyethylene insert was snapped into place with care taken to ensure appropriate locking of the insert. Finally, the patella was positioned and compressed into place using the patellar clamp. Again, care was taken to be sure that the component was fully seated. The knee was placed through a range of motion with the findings as described above.    The wound was copiously irrigated with sterile saline solution using the jet lavage system before the quadriceps tendon and retinacular layer were reapproximated using #0 Vicryl interrupted  sutures. The superficial retinacular layer also was closed using a running #0 Vicryl suture. The subcutaneous tissues were closed in several layers using 2-0 Vicryl interrupted sutures. The skin was closed using staples. A sterile honeycomb dressing was applied to the skin before the leg was wrapped with an Ace wrap to accommodate the Polar Care device. The patient was then awakened, extubated, and returned to the recovery room in satisfactory condition after tolerating the procedure well.

## 2024-03-04 NOTE — Evaluation (Signed)
 Physical Therapy Evaluation Patient Details Name: Mathew Porter MRN: [id number] DOB: 1956/03/08 Today's Date: 03/04/2024  History of Present Illness  Pt is a 68 yo male s/p R TKA. PMH of SOB, asthma, HTN, anxiety, DM.  Clinical Impression  Patient A&Ox4, did appear sleepy at end of session. Pt endorsed at baseline he is independent/modI with SPC, lives alone. Stated his wife is able to come assist him at discharge. Pt educated on HEP (able to perform a few exercises, and importance of compliance). Supine to sit with CGA, bed rails. Sit <> stand effortful with CGA and RW, but able to do so without physical assistance. He ambulated ~65ft with RW and CGA, close chair follow for safety. Pt did endorse lightheadedness but stated this was present throughout session, as well as headache (RN aware). Pt up in recliner with needs in reach at end of session.  Overall the patient demonstrated deficits (see PT Problem List) that impede the patient's functional abilities, safety, and mobility and would benefit from skilled PT intervention.         If plan is discharge home, recommend the following: A little help with bathing/dressing/bathroom;Assistance with cooking/housework;Assist for transportation;Help with stairs or ramp for entrance   Can travel by private vehicle        Equipment Recommendations Rolling walker (2 wheels);BSC/3in1 (bari RW and bari [facility])  Recommendations for Other Services       Functional Status Assessment Patient has had a recent decline in their functional status and demonstrates the ability to make significant improvements in function in a reasonable and predictable amount of time.     Precautions / Restrictions Precautions Precautions: Fall;Knee Precaution Booklet Issued: Yes (comment) Recall of Precautions/Restrictions: Intact Restrictions Weight Bearing Restrictions Per Provider Order: Yes RLE Weight Bearing Per Provider Order: Weight bearing as tolerated       Mobility  Bed Mobility Overal bed mobility: Needs Assistance Bed Mobility: Supine to Sit     Supine to sit: Supervision, Used rails, HOB elevated          Transfers Overall transfer level: Needs assistance Equipment used: Rolling walker (2 wheels) Transfers: Sit to/from Stand Sit to Stand: Contact guard assist           General transfer comment: effortful, extra time, verbal cues    Ambulation/Gait Ambulation/Gait assistance: Contact guard assist Gait Distance (Feet): 49 Feet (with chair follow) Assistive device: Rolling walker (2 wheels)   Gait velocity: decreased     General Gait Details: step through, increased cadence and confidence with distance, pt did endorse light headedness but was present throughout mobility  Stairs            Wheelchair Mobility     Tilt Bed    Modified Rankin (Stroke Patients Only)       Balance Overall balance assessment: Needs assistance Sitting-balance support: Feet supported Sitting balance-Leahy Scale: Fair     Standing balance support: Bilateral upper extremity supported, During functional activity Standing balance-Leahy Scale: Fair                               Pertinent Vitals/Pain Pain Assessment Pain Assessment: Faces Pain Score: 5  Faces Pain Scale: Hurts a little bit Pain Location: RLE with weight bearing, did not quantify for leg/ 5/10 for HA Pain Descriptors / Indicators: Grimacing, Guarding, Aching Pain Intervention(s): Limited activity within patient's tolerance, Monitored during session, Repositioned    Home Living Family/patient expects  to be discharged to:: Private residence Living Arrangements: Alone Available Help at Discharge:  (pt stated his wife could come help at discharge) Type of Home: House Home Access: Level entry       Home Layout: One level Home Equipment: Cane - single point      Prior Function Prior Level of Function : Independent/Modified Independent              Mobility Comments: use of SPC for mobility ADLs Comments: modI/I     Extremity/Trunk Assessment   Upper Extremity Assessment Upper Extremity Assessment: Generalized weakness    Lower Extremity Assessment Lower Extremity Assessment: Generalized weakness (able to bend both against gravity)       Communication        Cognition Arousal: Alert Behavior During Therapy: WFL for tasks assessed/performed   PT - Cognitive impairments: No apparent impairments                         Following commands: Intact       Cueing Cueing Techniques: Verbal cues     General Comments      Exercises Total Joint Exercises Ankle Circles/Pumps: AROM, Seated, Both, 10 reps Heel Slides: AROM, Strengthening, Right, 10 reps Long Arc Quad: AROM, AAROM, Strengthening, Right, 20 reps Goniometric ROM: 0 - ~85 degrees   Assessment/Plan    PT Assessment Patient needs continued PT services  PT Problem List Decreased strength;Pain;Decreased range of motion;Decreased knowledge of use of DME;Decreased balance;Decreased activity tolerance;Decreased mobility;Decreased knowledge of precautions       PT Treatment Interventions DME instruction;Balance training;Gait training;Neuromuscular re-education;Stair training;Functional mobility training;Patient/family education;Therapeutic activities;Therapeutic exercise    PT Goals (Current goals can be found in the Care Plan section)  Acute Rehab PT Goals Patient Stated Goal: to feel better PT Goal Formulation: With patient Time For Goal Achievement: 03/18/24 Potential to Achieve Goals: Good    Frequency BID     Co-evaluation               AM-PAC PT 6 Clicks Mobility  Outcome Measure Help needed turning from your back to your side while in a flat bed without using bedrails?: A Little Help needed moving from lying on your back to sitting on the side of a flat bed without using bedrails?: A Little Help needed moving to and  from a bed to a chair (including a wheelchair)?: A Little Help needed standing up from a chair using your arms (e.g., wheelchair or bedside chair)?: A Little Help needed to walk in hospital room?: A Little Help needed climbing 3-5 steps with a railing? : A Little 6 Click Score: 18    End of Session   Activity Tolerance: Patient tolerated treatment well Patient left: in chair;with call bell/phone within reach (polar care in place)   PT Visit Diagnosis: Other abnormalities of gait and mobility (R26.89);Difficulty in walking, not elsewhere classified (R26.2);Muscle weakness (generalized) (M62.81);Pain Pain - Right/Left: Right Pain - part of body: Knee    Time: 8488-8461 PT Time Calculation (min) (ACUTE ONLY): 27 min   Charges:   PT Evaluation $PT Eval Low Complexity: 1 Low PT Treatments $Therapeutic Activity: 23-37 mins PT General Charges $$ ACUTE PT VISIT: 1 Visit         Doyal Shams PT, DPT 4:00 PM,03/04/24

## 2024-03-04 NOTE — OR PostOp (Incomplete)
 PACU TO INPATIENT HANDOFF REPORT  Name/Age/Gender Mathew Porter 68 y.o. male  Code Status    Code Status Orders  (From admission, onward)           Start     Ordered   03/04/24 1319  Full code  Continuous       Question:  By:  Answer:  Consent: discussion documented in EHR   03/04/24 1318           Code Status History     Date Active Date Inactive Code Status Order ID Comments User Context   11/05/2018 1607 11/08/2018 2140 Full Code [id number]  Yisroel Sleight, MD ED       Home/SNF/Other {Discharge Destination:18313::Home}  Chief Complaint Primary osteoarthritis of right knee [M17.11] Old complex tear of medial meniscus of right knee [M23.203] Chronic pain of right knee [M25.561, G89.29] Class 3 severe obesity due to excess calories with serious comorbidity and body mass index (BMI) of 50.0 to 59.9 in adult [E66.813, Z68.43] 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, 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]  Level of Care/Admitting Diagnosis ED Disposition     None       Medical History Past Medical History:  Diagnosis Date   Anemia    Anxiety    Asthma    Cellulitis    Diabetes mellitus without complication (HCC)    Dyspnea    Headache    HTN (hypertension)    Obesity    Pneumonia    Primary osteoarthritis of right knee    Pyogenic granuloma of skin    Sleep apnea     Allergies Allergies  Allergen Reactions   Doxycycline  Hives and Itching    IV Location/Drains/Wounds Patient Lines/Drains/Airways Status     Active Line/Drains/Airways     Name Placement date Placement time Site Days   Peripheral IV 03/04/24 20 G Anterior;Left;Proximal Forearm 03/04/24  0643  Forearm  less than 1   Wound 03/04/24 0916 Surgical Closed Surgical Incision Knee Right 03/04/24  0916  Knee  less than 1            Labs/Imaging Results for orders placed or performed during the hospital encounter of 03/04/24 (from the past 48 hours)  Glucose, capillary     Status:  Abnormal   Collection Time: 03/04/24  6:20 AM  Result Value Ref Range   Glucose-Capillary 157 (H) 70 - 99 mg/dL    Comment: Glucose reference range applies only to samples taken after fasting for at least 8 hours.  Glucose, capillary     Status: Abnormal   Collection Time: 03/04/24 10:04 AM  Result Value Ref Range   Glucose-Capillary 172 (H) 70 - 99 mg/dL    Comment: Glucose reference range applies only to samples taken after fasting for at least 8 hours.   DG Knee Right Port Result Date: 03/04/2024 CLINICAL DATA:  Status post knee replacement, right. EXAM: PORTABLE RIGHT KNEE - 1-2 VIEW COMPARISON:  None Available. FINDINGS: Right knee arthroplasty in expected alignment. No periprosthetic lucency or fracture. Recent postsurgical change includes air and edema in the soft tissues and joint space. Anterior skin staples in place. IMPRESSION: Right knee arthroplasty without immediate postoperative complication. Electronically Signed   By: Andrea Gasman M.D.   On: 03/04/2024 12:27    Pending Labs   Vitals/Pain Today's Vitals   03/04/24 1330 03/04/24 1345 03/04/24 1400 03/04/24 1415  BP: (!) 146/73 (!) 151/93 (!) 151/94 (!) 147/86  Pulse: 88 86 80 81  Resp: 16 (!)  21 14 14   Temp:    (!) 97.3 F (36.3 C)  TempSrc:      SpO2: 95% 99% 91% 93%  Weight:      Height:      PainSc:   Asleep 0-No pain    Isolation Precautions @ISOLATION @  Administered Medications Periop Administered Meds from 03/04/2024 0612 to 03/04/2024 1422       Date/Time Order Dose Route Action Action by Comments    03/04/2024 0959 EDT 0.9 %  sodium chloride  infusion 0  Intravenous Stopped Pidana, Munesh, CRNA --    03/04/2024 0922 EDT 0.9 %  sodium chloride  infusion -- Intravenous Anesthesia Volume Adjustment Jackye Spanner, CRNA --    03/04/2024 0730 EDT 0.9 %  sodium chloride  infusion -- Intravenous New Bag/Given Jackye Spanner, CRNA --    03/04/2024 0929 EDT 0.9 % irrigation (POUR BTL) 500 mL Irrigation Given  Edie Norleen PARAS, MD --    03/04/2024 1230 EDT 0.9% NaCl bolus PEDS 0 mL Intravenous Parthenia Sherrine Annabelle JAYSON, RN --    03/04/2024 1119 EDT 0.9% NaCl bolus PEDS 250 mL Intravenous New Bag/Given Lue Barnie POUR, RN --    03/04/2024 0946 EDT acetaminophen  (OFIRMEV ) IV 1,000 mg Intravenous Given Jackye Spanner, CRNA --    03/04/2024 0859 EDT bupivacaine  liposome (EXPAREL  1.3%) in normal saline Optime 60 mL Infiltration Given Poggi, Norleen PARAS, MD --    03/04/2024 0743 EDT bupivacaine (PF) (MARCAINE ) 0.5 % injection 2.5 mL Infiltration Given Pidana, Munesh, CRNA Intrathecal    03/04/2024 0859 EDT bupivacaine -epinephrine  (PF) (MARCAINE  W/ EPI) 0.5% -1:200000 injection 30 mL Infiltration Given Poggi, John J, MD --    03/04/2024 0746 EDT ceFAZolin  (ANCEF ) IVPB 2g/100 mL premix 3 g Intravenous Given Pidana, Munesh, CRNA --    03/04/2024 1345 EDT ceFAZolin  (ANCEF ) IVPB 3g/150 mL premix 3 g Intravenous Given Sherrine Annabelle JAYSON, RN --    03/04/2024 0646 EDT chlorhexidine  (PERIDEX ) 0.12 % solution 15 mL 15 mL Mouth/Throat Given Ezzard Dorthea SQUIBB, RN --    03/04/2024 0745 EDT dexamethasone  (DECADRON ) injection 10 mg Intravenous Given Pidana, Spanner, CRNA --    03/04/2024 1056 EDT droperidol  (INAPSINE ) 2.5 MG/ML injection 0.625 mg 0.625 mg Intravenous Given Sherrine Annabelle JAYSON, RN --    03/04/2024 0948 EDT fentaNYL  (SUBLIMAZE ) injection 25 mcg Intravenous Given Jackye Spanner, CRNA --    03/04/2024 0755 EDT fentaNYL  (SUBLIMAZE ) injection 50 mcg Intravenous Given Jackye Spanner, CRNA --    03/04/2024 1039 EDT ketorolac  (TORADOL ) 15 MG/ML injection 15 mg 15 mg Intravenous Given Sherrine Annabelle JAYSON, RN --    03/04/2024 0859 EDT ketorolac  (TORADOL ) 30 MG/ML injection 30 mg Intramuscular Given Poggi, Norleen PARAS, MD --    03/04/2024 0745 EDT lidocaine  (cardiac) 100 mg/52mL (XYLOCAINE ) injection 2% 80 mg Intravenous Given Pidana, Spanner, CRNA --    03/04/2024 0730 EDT midazolam  (VERSED ) 5 MG/5ML injection 2 mg Intravenous Given Pidana, Spanner, CRNA  --    03/04/2024 0730 EDT ondansetron  (ZOFRAN ) injection 4 mg Intravenous Given Jackye Spanner, CRNA --    03/04/2024 0646 EDT Oral care mouth rinse -- Mouth Rinse See Alternative Ezzard Dorthea SQUIBB, RN --    03/04/2024 1258 EDT oxyCODONE  (Oxy IR/ROXICODONE ) immediate release tablet 5 mg 5 mg Oral Given Sherrine Annabelle JAYSON, RN --    03/04/2024 1258 EDT oxyCODONE  (ROXICODONE ) 5 MG/5ML solution 5 mg -- Oral See Alternative Sherrine Annabelle JAYSON, RN --    03/04/2024 0919 EDT phenylephrine  (NEO-SYNEPHRINE) 20mg /NS premix infusion 0 mcg/min Intravenous Stopped Pidana, Munesh, CRNA --  03/04/2024 0806 EDT phenylephrine  (NEO-SYNEPHRINE) 20mg /NS premix infusion 10 mcg/min Intravenous Rate/Dose Change Jackye Spanner, CRNA --    03/04/2024 0805 EDT phenylephrine  (NEO-SYNEPHRINE) 20mg /NS premix infusion 20 mcg/min Intravenous Rate/Dose Change Pidana, Munesh, CRNA --    03/04/2024 0802 EDT phenylephrine  (NEO-SYNEPHRINE) 20mg /NS premix infusion 40 mcg/min Intravenous New Bag/Given Pidana, Spanner, CRNA --    03/04/2024 0800 EDT PHENYLephrine  80 mcg/ml in normal saline Adult IV Push Syringe (For Blood Pressure Support) 80 mcg Intravenous Given Pidana, Munesh, CRNA --    03/04/2024 0951 EDT propofol  (DIPRIVAN ) 10 mg/mL bolus/IV push 0 mcg/kg/min Intravenous Stopped Pidana, Spanner, CRNA --    03/04/2024 0944 EDT propofol  (DIPRIVAN ) 10 mg/mL bolus/IV push 40 mg Intravenous Given Pidana, Munesh, CRNA --    03/04/2024 0834 EDT propofol  (DIPRIVAN ) 10 mg/mL bolus/IV push 70 mcg/kg/min Intravenous Rate/Dose Change Pidana, Spanner, CRNA --    03/04/2024 0815 EDT propofol  (DIPRIVAN ) 10 mg/mL bolus/IV push 80 mcg/kg/min Intravenous Rate/Dose Change Pidana, Spanner, CRNA --    03/04/2024 0803 EDT propofol  (DIPRIVAN ) 10 mg/mL bolus/IV push 100 mcg/kg/min Intravenous Rate/Dose Change Pidana, Spanner, CRNA --    03/04/2024 0757 EDT propofol  (DIPRIVAN ) 10 mg/mL bolus/IV push 50 mcg/kg/min Intravenous New Bag/Given Jackye Spanner, CRNA --    03/04/2024 0755 EDT propofol  (DIPRIVAN ) 10 mg/mL bolus/IV push 200 mg Intravenous Given Jackye Spanner, CRNA --    03/04/2024 0841 EDT rocuronium  (ZEMURON ) injection 20 mg Intravenous Given Jackye Spanner, CRNA --    03/04/2024 0803 EDT rocuronium  (ZEMURON ) injection 50 mg Intravenous Given Jackye Spanner, CRNA --    03/04/2024 0914 EDT sodium chloride  irrigation 0.9 % 3,000 mL Irrigation Given Poggi, Norleen PARAS, MD --    03/04/2024 0929 EDT sodium chloride  irrigation 0.9 % 3,000 mL Irrigation Given Poggi, John J, MD --    03/04/2024 0755 EDT succinylcholine  (ANECTINE ) syringe 160 mg Intravenous Given Pidana, Munesh, CRNA --    03/04/2024 0950 EDT sugammadex  sodium (BRIDION ) injection 350 mg Intravenous Given Jackye Spanner, CRNA --    03/04/2024 0802 EDT tranexamic acid  (CYKLOKAPRON ) IVPB 1,000 mg 1,000 mg Intravenous Given Jackye Spanner, CRNA --    03/04/2024 0859 EDT triamcinolone  acetonide (KENALOG -40) injection 80 mg Intramuscular Given Poggi, Norleen PARAS, MD --       Mobility {Mobility:20148}

## 2024-03-04 NOTE — Anesthesia Procedure Notes (Signed)
 Procedure Name: Intubation Date/Time: 03/04/2024 7:56 AM  Performed by: Jackye Spanner, CRNAPre-anesthesia Checklist: Patient identified, Patient being monitored, Timeout performed, Emergency Drugs available and Suction available Patient Re-evaluated:Patient Re-evaluated prior to induction Oxygen Delivery Method: Circle system utilized Preoxygenation: Pre-oxygenation with 100% oxygen Induction Type: IV induction, Rapid sequence and Cricoid Pressure applied Laryngoscope Size: McGrath and 4 Grade View: Grade I Tube type: Oral Tube size: 7.5 mm Number of attempts: 1 Airway Equipment and Method: Stylet Placement Confirmation: ETT inserted through vocal cords under direct vision, positive ETCO2 and breath sounds checked- equal and bilateral Secured at: 22 cm Tube secured with: Tape Dental Injury: Teeth and Oropharynx as per pre-operative assessment  Comments: Smooth atraumatic intubation, no complications noted.

## 2024-03-05 ENCOUNTER — Encounter: Payer: Self-pay | Admitting: Surgery

## 2024-03-05 DIAGNOSIS — M1711 Unilateral primary osteoarthritis, right knee: Secondary | ICD-10-CM | POA: Diagnosis not present

## 2024-03-05 LAB — GLUCOSE, CAPILLARY: Glucose-Capillary: 181 mg/dL — ABNORMAL HIGH (ref 70–99)

## 2024-03-05 NOTE — TOC Initial Note (Addendum)
 Transition of Care [facility]) - Initial/Assessment Note    Patient Details  Name: Mathew Porter MRN: [id number] Date of Birth: May 01, 1956  [facility] CM/SW Contact:    Delphine KANDICE Bring, RN Phone Number: 03/05/2024, 8:28 AM  Clinical Narrative:                 CM spoke with patient this morning. Patient sitting on side of bed. CM verified address and phone number. Patient states that his wife will transport him home.  CM explained that MD had order RW and 3 and 1 commode. Patient is not sure he would like the 3 in 1 commode. Patient states that he did have a walker  in past but does not have it now. CM asked if insurance paid for the walker. Patient states no.  HH was arranged prior to surgery with [facility]. CM asked if they had reached out to him. Patient states no.   Mitch from [employer] notified about DME and patient is d/c today   0930 Mitch informed CM that current order is for a regular walker. CM had sent secure message to nurse and Dr. Edie asking for updated order  for a bariatric walker           Patient Goals and CMS Choice            Expected Discharge Plan and Services         Expected Discharge Date: 03/05/24                                    Prior Living Arrangements/Services                       Activities of Daily Living   ADL Screening (condition at time of admission) Independently performs ADLs?: Yes (appropriate for developmental age) Is the patient deaf or have difficulty hearing?: No Does the patient have difficulty seeing, even when wearing glasses/contacts?: No Does the patient have difficulty concentrating, remembering, or making decisions?: No  Permission Sought/Granted                  Emotional Assessment              Admission diagnosis:  Primary osteoarthritis of right knee [M17.11] Old complex tear of medial meniscus of right knee [M23.203] Chronic pain of right knee [M25.561, G89.29] Class  3 severe obesity due to excess calories with serious comorbidity and body mass index (BMI) of 50.0 to 59.9 in adult [E66.813, Z68.43] 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, 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] Patient Active Problem List   Diagnosis Date Noted   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, right 03/04/2024   Lymphedema 01/31/2019   Diabetes (HCC) 01/28/2019   Swelling of limb 01/28/2019   Left leg cellulitis 11/05/2018   Anxiety 05/23/2018   Chickenpox 05/23/2018   Colon polyp 05/23/2018   Diverticulosis 05/23/2018   Headache 05/23/2018   Hypertension 05/23/2018   OAB (overactive bladder) 05/23/2018   Morbid obesity with BMI of 40.0-44.9, adult (HCC) 12/21/2015   PCP:  Valora Agent, MD Pharmacy:   [id number] [address], [place] - [address] AT [facility] OF [address] [place] [address] Phone: [phone] Fax: [phone]     Social Drivers of Health (SDOH) Social History: SDOH Screenings   Food  Insecurity: No Food Insecurity (03/04/2024)  Housing: Low Risk  (03/04/2024)  Transportation Needs: No Transportation Needs (03/04/2024)  Utilities: Not At Risk (03/04/2024)  Financial Resource Strain: Low Risk  (02/05/2024)   Received from [facility]  Social Connections: Moderately Isolated (03/04/2024)  Tobacco Use: Medium Risk (03/04/2024)   SDOH Interventions:     Readmission Risk Interventions     No data to display

## 2024-03-05 NOTE — Anesthesia Postprocedure Evaluation (Signed)
 Anesthesia Post Note  Patient: Mathew Porter  Procedure(s) Performed: ARTHROPLASTY, KNEE, TOTAL (Right: Knee)  Patient location during evaluation: Nursing Unit Anesthesia Type: Spinal Level of consciousness: awake, awake and alert and oriented Pain management: pain level controlled Vital Signs Assessment: post-procedure vital signs reviewed and stable Respiratory status: spontaneous breathing and nonlabored ventilation Cardiovascular status: blood pressure returned to baseline and stable Postop Assessment: no headache and no backache Anesthetic complications: no   No notable events documented.   Last Vitals:  Vitals:   03/04/24 2337 03/05/24 0343  BP: (!) 157/73   Pulse: 91 88  Resp: 20 18  Temp:  36.7 C  SpO2: 96% 97%    Last Pain:  Vitals:   03/05/24 0343  TempSrc: Oral  PainSc:                  Corean Marchi

## 2024-03-05 NOTE — Progress Notes (Signed)
  Subjective: 1 Day Post-Op Procedure(s) (LRB): ARTHROPLASTY, KNEE, TOTAL (Right) Patient reports pain as mild.   Patient is well, and has had no acute complaints or problems Plan is to go Home after hospital stay. Negative for chest pain and shortness of breath Fever: no Gastrointestinal:Negative for nausea and vomiting Reports his is passing gas and urinating this AM.  Objective: Vital signs in last 24 hours: Temp:  [97.2 F (36.2 C)-98.2 F (36.8 C)] 98.2 F (36.8 C) (07/16 0740) Pulse Rate:  [75-95] 95 (07/16 0740) Resp:  [14-25] 15 (07/16 0740) BP: (114-157)/(68-114) 155/82 (07/16 0740) SpO2:  [91 %-99 %] 97 % (07/16 0740)  Intake/Output from previous day:  Intake/Output Summary (Last 24 hours) at 03/05/2024 0756 Last data filed at 03/05/2024 0047 Gross per 24 hour  Intake 1643.33 ml  Output 570 ml  Net 1073.33 ml    Intake/Output this shift: No intake/output data recorded.  Labs: No results for input(s): HGB in the last 72 hours. No results for input(s): WBC, RBC, HCT, PLT in the last 72 hours. No results for input(s): NA, K, CL, CO2, BUN, CREATININE, GLUCOSE, CALCIUM  in the last 72 hours. No results for input(s): LABPT, INR in the last 72 hours.   EXAM General - Patient is Alert, Appropriate, and Oriented Extremity - ABD soft Neurovascular intact Dorsiflexion/Plantar flexion intact Incision: dressing C/D/I No cellulitis present Dressing/Incision - clean, dry, no drainage noted to the right knee honeycomb dressing. Motor Function - intact, moving foot and toes well on exam.  Abdomen soft with intact bowel sounds this AM.  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   Weight as of this encounter: 152 kg. Advance diet Up with therapy D/C IV fluids when tolerating po intake.  Vitals reviewed this AM. Up with PT today.  Patient is urinating well. Continue to work on a BM. Plan for discharge home today with HHPT.  DVT Prophylaxis - TED hose and Eliquis  Weight-Bearing as tolerated to right leg  J. Gustavo Level, PA-C [facility] 03/05/2024, 7:56 AM

## 2024-03-05 NOTE — Plan of Care (Signed)
 Progressing towards discharge

## 2024-03-05 NOTE — Discharge Summary (Signed)
 Physician Discharge Summary  Patient ID: Mathew Porter MRN: [id number] DOB/AGE: 01-04-1956 68 y.o.  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, right 03/04/2024   Lymphedema 01/31/2019   Diabetes (HCC) 01/28/2019   Swelling of limb 01/28/2019   Left leg cellulitis 11/05/2018   Anxiety 05/23/2018   Chickenpox 05/23/2018   Colon polyp 05/23/2018   Diverticulosis 05/23/2018   Headache 05/23/2018   Hypertension 05/23/2018   OAB (overactive bladder) 05/23/2018   Morbid obesity with BMI of 40.0-44.9, adult (HCC) 12/21/2015    Past Medical History:  Diagnosis Date   Anemia    Anxiety    Asthma    Cellulitis    Diabetes mellitus without complication (HCC)    Dyspnea    Headache    HTN (hypertension)    Obesity    Pneumonia    Primary osteoarthritis of right knee    Pyogenic granuloma of skin    Sleep apnea      Transfusion: None.   Consultants (if any):   Discharged Condition: Improved  Hospital Course: Mathew Porter is an 68 y.o. male who was admitted 03/04/2024 with a diagnosis of primary osteoarthritis of the right knee and went to the operating room on 03/04/2024 and underwent the above named procedures.    Surgeries: Procedure(s): ARTHROPLASTY, KNEE, TOTAL on 03/04/2024 Patient tolerated the surgery well. Taken to PACU where he was stabilized and then transferred to the post op recovery area.  Started on Eliquis  2.5mg  every 12 hrs. Foot pumps  applied bilaterally at 80 mm. Heels elevated on bed with rolled towels. No evidence of DVT. Negative Homan. Physical therapy started on day #0 for gait training and transfer. OT started day #1 for ADL and assisted devices.  Patient's IV was removed on POD1.  Implants:  Right TKA using all-pressfit Zimmer Persona system with a #11 PCR femur, a(n) G-sized  tibial tray with a 10 mm medial congruent E-poly insert, and a 9 x 32 mm all-poly 3-pegged domed patella.   He was given perioperative antibiotics:  Anti-infectives (From admission, onward)    Start     Dose/Rate Route Frequency Ordered Stop   03/04/24 1400  ceFAZolin  (ANCEF ) IVPB 3g/150 mL premix        3 g 300 mL/hr over 30 Minutes Intravenous Every 6 hours 03/04/24 1106 03/04/24 2043   03/04/24 0600  ceFAZolin  (ANCEF ) IVPB 2g/100 mL premix        2 g 200 mL/hr over 30 Minutes Intravenous On call to O.R. 03/03/24 2222 03/04/24 9183     .  He was given sequential compression devices, early ambulation, and Eliquis  for DVT prophylaxis.  He benefited maximally from the hospital stay and there were no complications.    Recent vital signs:  Vitals:   03/05/24 0343 03/05/24 0740  BP:  (!) 155/82  Pulse: 88 95  Resp: 18 15  Temp: 98 F (36.7 C) 98.2 F (36.8 C)  SpO2: 97% 97%    Recent laboratory studies:  Lab Results  Component Value Date   HGB 14.4 02/21/2024   HGB 14.4 07/09/2022   HGB 13.8 12/28/2019   Lab Results  Component Value Date   WBC 8.2 02/21/2024   PLT 230 02/21/2024   Lab Results  Component Value Date   INR 1.0 12/28/2019   Lab Results  Component Value Date   NA 138 02/21/2024   K 3.8 02/21/2024   CL 105 02/21/2024   CO2 24 02/21/2024   BUN 15 02/21/2024   CREATININE 0.78 02/21/2024   GLUCOSE 109 (H) 02/21/2024    Discharge Medications:   Allergies as of 03/05/2024       Reactions   Doxycycline  Hives, Itching        Medication List     STOP taking these medications    naproxen  sodium 220 MG tablet Commonly known as: ALEVE       TAKE these medications    apixaban  2.5 MG Tabs tablet Commonly known as: Eliquis  Take 1 tablet (2.5 mg total) by mouth 2 (two) times daily.   aspirin -acetaminophen -caffeine  250-250-65 MG tablet Commonly known as: EXCEDRIN  MIGRAINE Take 2 tablets by mouth every 6 (six) hours as needed for headache.   chlorhexidine  4 % external liquid Commonly known as: HIBICLENS  Apply 15 mLs (1 Application total) topically as directed for 30 doses. Use as directed daily for 5 days every other week for 6 weeks.   chlorhexidine  4 % external liquid Commonly known as: HIBICLENS  Apply 15 mLs (1 Application total) topically as directed for 30 doses. Use as directed daily for 5 days every other week for 6 weeks.   cholecalciferol  25 MCG (1000 UNIT) tablet Commonly known as: VITAMIN D3 Take 1,000 Units by mouth daily.   cyanocobalamin  1000 MCG tablet Commonly known as: VITAMIN B12 Take 1,000 mcg by mouth daily.   glipiZIDE  2.5 MG 24 hr tablet Commonly known as: GLUCOTROL  XL Take 2.5 mg by mouth daily.   lamoTRIgine  100 MG 24 hour tablet Commonly known as: LAMICTAL  XR Take 100 mg by mouth at bedtime.   metFORMIN  500 MG 24 hr tablet Commonly known as: GLUCOPHAGE -XR Take 1,000 mg by mouth daily with supper.   Mounjaro 10 MG/0.5ML Pen Generic drug: tirzepatide Inject 10 mg into the skin every Friday.   mupirocin  ointment 2 % Commonly known as: BACTROBAN  Place 1 Application into the nose 2 (two) times daily for 60 doses. Use as directed 2 times daily for 5 days every other week for 6 weeks.   mupirocin  ointment 2 % Commonly known as: BACTROBAN  Place 1 Application into the nose 2 (two) times daily for 60 doses. Use as directed 2 times daily for 5 days every other week for 6 weeks.   ondansetron  4 MG disintegrating tablet Commonly known as: ZOFRAN -ODT Take 1 tablet (4 mg total) by mouth every 8 (eight) hours as needed for nausea or  vomiting.   oxyCODONE  5 MG immediate release tablet Commonly known as: Roxicodone  Take 1-2 tablets (5-10 mg total) by mouth every 4 (four) hours as needed for moderate pain (pain score 4-6) or severe pain (pain score 7-10).   simvastatin  20 MG tablet Commonly known as: ZOCOR  Take 20 mg by mouth at bedtime.   SUMAtriptan  50 MG tablet Commonly known as: IMITREX  Take 1 tablet (50 mg total) by mouth 3 (three) times daily as needed for migraine or headache. May repeat in 2 hours if headache persists or recurs.   verapamil  240 MG CR tablet Commonly known as: CALAN -SR Take 240 mg  by mouth every evening.               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 EXAM: PORTABLE RIGHT KNEE - 1-2 VIEW COMPARISON:  None Available. FINDINGS: Right knee arthroplasty in expected alignment. No periprosthetic lucency or fracture. Recent postsurgical change includes air and edema in the soft tissues and joint space. Anterior skin staples in place. IMPRESSION: Right knee arthroplasty without immediate postoperative complication. Electronically Signed   By: Andrea Gasman M.D.   On: 03/04/2024 12:27   Disposition: Plan for discharge home today pending progress with PT.   Follow-up Information     Kip Lynwood Double, PA-C Follow up on 03/19/2024.   Specialty: Physician Assistant Why: @ 9:45 am Contact information: [address] [place] [address] [phone]         Kristie Cain, PT Follow up on 03/19/2024.    Specialty: Physical Therapy Why: @ 10:30 am for physical therapy Contact information: [address] [place] [address] [phone]         Poggi, Norleen PARAS, MD Follow up on 04/14/2024.   Specialty: Orthopedic Surgery Why: @ 8:15 for postop appt Contact information: [address] [facility] [place] [address] [phone]                Signed: Lynwood LITTIE Kip PA-C 03/05/2024, 7:58 AM

## 2024-03-05 NOTE — Evaluation (Signed)
 Occupational Therapy Evaluation Patient Details Name: Mathew Porter MRN: [id number] DOB: [birth year] Today's Date: 03/05/2024   History of Present Illness   Pt is a 68 yo male s/p R TKA. PMH of SOB, asthma, HTN, anxiety, DM.     Clinical Impressions Pt seen for OT evaluation this date, POD#2 from above surgery. Pt was Modified Independent in all ADLs prior to surgery, however occasionally using SPC for MOBILITY due to R knee pain. Pt is eager to return to PLOF with less pain and improved safety and independence. Pt currently requires minimal assist for LB dressing while in seated position due to pain and limited AROM of R knee. Pt instructed in falls prevention strategies, home/routines modifications, DME/AE for LB bathing and dressing tasks, and compression stocking mgt. Pt would benefit from skilled OT services including additional instruction in dressing techniques with or without assistive devices for dressing and bathing skills to support recall and carryover prior to discharge and ultimately to maximize safety, independence, and minimize falls risk and caregiver burden.       If plan is discharge home, recommend the following:   A little help with walking and/or transfers;A little help with bathing/dressing/bathroom     Functional Status Assessment   Patient has had a recent decline in their functional status and demonstrates the ability to make significant improvements in function in a reasonable and predictable amount of time.     Equipment Recommendations    (Would recommend bariatric 3:1 commode however, patient reported that he did not prefer this device and removed it from around his commode.  He reports that he will be having temporary grab bars installed around commode.)     Recommendations for Other Services         Precautions/Restrictions   Precautions Precautions: Fall;Knee Recall of Precautions/Restrictions: Intact Restrictions Weight Bearing  Restrictions Per Provider Order: Yes RLE Weight Bearing Per Provider Order: Weight bearing as tolerated     Mobility Bed Mobility Overal bed mobility: Needs Assistance Bed Mobility: Supine to Sit     Supine to sit: Supervision, Used rails, HOB elevated          Transfers Overall transfer level: Needs assistance Equipment used: Rolling walker (2 wheels) Transfers: Sit to/from Stand Sit to Stand: Contact guard assist           General transfer comment: effortful, extra time, verbal cues      Balance Overall balance assessment: Needs assistance Sitting-balance support: Feet supported Sitting balance-Leahy Scale: Good     Standing balance support: Bilateral upper extremity supported, During functional activity Standing balance-Leahy Scale: Fair                             ADL either performed or assessed with clinical judgement   ADL Overall ADL's : Needs assistance/impaired Eating/Feeding: Independent   Grooming: Modified independent   Upper Body Bathing: Modified independent   Lower Body Bathing: Minimal assistance Lower Body Bathing Details (indicate cue type and reason): difficulty accessing RLE, LLE accessed with modified technique Upper Body Dressing : Modified independent   Lower Body Dressing: Minimal assistance Lower Body Dressing Details (indicate cue type and reason): difficulty accessing RLE, LLE accessed with modified technique while seated Toilet Transfer: Supervision/safety Toilet Transfer Details (indicate cue type and reason): using grab bar Toileting- Clothing Manipulation and Hygiene: Supervision/safety   Tub/ Shower Transfer: International aid/development worker Details (indicate cue type and reason): simulated - recommend seated showers  Functional mobility during ADLs: Supervision/safety General ADL Comments: difficulty accessing RLE due to pain and decreased ROM, LLE accessed with modified technique     Vision Baseline  Vision/History: 0 No visual deficits Patient Visual Report: No change from baseline       Perception         Praxis         Pertinent Vitals/Pain Pain Assessment Pain Assessment: 0-10 Pain Score: 5  Faces Pain Scale: Hurts a little bit Pain Location: RLE with weight bearing, did not quantify for leg/ 5/10 for HA Pain Descriptors / Indicators: Grimacing, Guarding, Aching     Extremity/Trunk Assessment Upper Extremity Assessment Upper Extremity Assessment: Generalized weakness (BUEs 4/5, ROM with WFL for BUEs)   Lower Extremity Assessment Lower Extremity Assessment: Defer to PT evaluation       Communication Communication Communication: No apparent difficulties   Cognition Arousal: Alert Behavior During Therapy: WFL for tasks assessed/performed Cognition: No apparent impairments                               Following commands: Intact       Cueing  General Comments   Cueing Techniques: Verbal cues      Exercises Other Exercises Other Exercises: Education on role of OT, education on benefits of ted hose, education and training on AE (hip/knee kit - reacher and sock aid)   Shoulder Instructions      Home Living Family/patient expects to be discharged to:: Private residence Living Arrangements: Alone (Spouse does not live in house however will be providing continuous assistance for the first 1-2 days and then intermittently afterwards.) Available Help at Discharge: Available PRN/intermittently;Family (Spouse does not live in house however will be providing continuous assistance for the first 1-2 days and then intermittently afterwards.) Type of Home: House Home Access: Level entry     Home Layout: One level         Bathroom Toilet: Standard     Home Equipment: Cane - single point   Additional Comments: has standard bathroom with regular commode.  Reports he will have temporary grab bars installed      Prior Functioning/Environment  Prior Level of Function : Independent/Modified Independent             Mobility Comments: use of SPC for mobility ADLs Comments: modI/I    OT Problem List: Decreased strength;Decreased coordination;Decreased activity tolerance;Decreased safety awareness;Impaired balance (sitting and/or standing);Decreased knowledge of use of DME or AE   OT Treatment/Interventions: Self-care/ADL training;Therapeutic activities;Therapeutic exercise;Energy conservation;Patient/family education;DME and/or AE instruction;Balance training      OT Goals(Current goals can be found in the care plan section)   Acute Rehab OT Goals Patient Stated Goal: Return home OT Goal Formulation: With patient Time For Goal Achievement: 03/19/24 Potential to Achieve Goals: Good ADL Goals Pt Will Perform Lower Body Bathing: with modified independence;sit to/from stand Pt Will Perform Lower Body Dressing: with modified independence;sit to/from stand Pt Will Transfer to Toilet: with modified independence;grab bars Pt Will Perform Toileting - Clothing Manipulation and hygiene: with modified independence   OT Frequency:  Min 2X/week    Co-evaluation              AM-PAC OT 6 Clicks Daily Activity     Outcome Measure Help from another person eating meals?: None Help from another person taking care of personal grooming?: None Help from another person toileting, which includes using toliet, bedpan, or urinal?:  A Little Help from another person bathing (including washing, rinsing, drying)?: A Little Help from another person to put on and taking off regular upper body clothing?: None Help from another person to put on and taking off regular lower body clothing?: A Little 6 Click Score: 21   End of Session Equipment Utilized During Treatment: Rolling walker (2 wheels)  Activity Tolerance: Patient tolerated treatment well Patient left: in bed;with call bell/phone within reach;with nursing/sitter in room (PA in room  and attending to patient)  OT Visit Diagnosis: Muscle weakness (generalized) (M62.81)                Time: 9270-9249 OT Time Calculation (min): 21 min Charges:  OT General Charges $OT Visit: 1 Visit OT Evaluation $OT Eval Low Complexity: 1 Low  Jillyan Plitt OTR/L   Harlene LITTIE Sharps 03/05/2024, 8:12 AM

## 2024-03-05 NOTE — Progress Notes (Signed)
 DISCHARGE NOTE:    Pt IV removed and dc instructions given. Pt has both TED hose on and in place. Pt received a RW to hospital room. Pt also received medication scripts and polar care. Pt voices no questions or concerns at this time.Pt wheeled down to medical mall entrance by staff. Pt's wife provided transportation.

## 2024-03-05 NOTE — Progress Notes (Signed)
 Physical Therapy Treatment Patient Details Name: Mathew Porter MRN: [id number] DOB: [birth year] Today's Date: 03/05/2024   History of Present Illness Pt is a 68 yo male s/p R TKA. PMH of SOB, asthma, HTN, anxiety, DM.    PT Comments  Patient alert, agreeable to PT, seated EOB finishing breakfast. Reported 2-3/10 in RLE. He was able to perform transfers with RW and CGA, improved ease compared to evaluation. He ambulated ~257ft with RW and CGA-supervision, still exhibited decreased gait velocity, stride length and mildly antalgic on RLE. Stair navigation (1 step) performed, pt verbalized understanding. HEP compliance and car transfers verbally reviewed. The patient would benefit from further skilled PT intervention to continue to progress towards goals.     If plan is discharge home, recommend the following: A little help with bathing/dressing/bathroom;Assistance with cooking/housework;Assist for transportation;Help with stairs or ramp for entrance   Can travel by private vehicle        Equipment Recommendations  Rolling walker (2 wheels);BSC/3in1 (bari RW and bari BSC)    Recommendations for Other Services       Precautions / Restrictions Precautions Precautions: Fall;Knee Precaution Booklet Issued: Yes (comment) Recall of Precautions/Restrictions: Intact Restrictions Weight Bearing Restrictions Per Provider Order: Yes RLE Weight Bearing Per Provider Order: Weight bearing as tolerated     Mobility  Bed Mobility               General bed mobility comments: pt sitting EOB at start/end of session    Transfers Overall transfer level: Needs assistance Equipment used: Rolling walker (2 wheels) Transfers: Sit to/from Stand Sit to Stand: Contact guard assist                Ambulation/Gait Ambulation/Gait assistance: Contact guard assist Gait Distance (Feet): 220 Feet Assistive device: Rolling walker (2 wheels)   Gait velocity: decreased     General Gait  Details: improved cadence and step through pattern compared to eval, still mildly antalgic but steady   Stairs Stairs: Yes Stairs assistance: Contact guard assist Stair Management: Two rails, Step to pattern Number of Stairs: 1 General stair comments: pt able to verbalize safe technique   Wheelchair Mobility     Tilt Bed    Modified Rankin (Stroke Patients Only)       Balance Overall balance assessment: Needs assistance Sitting-balance support: Feet supported Sitting balance-Leahy Scale: Good     Standing balance support: Bilateral upper extremity supported, During functional activity Standing balance-Leahy Scale: Fair                              Hotel manager: No apparent difficulties  Cognition Arousal: Alert Behavior During Therapy: WFL for tasks assessed/performed   PT - Cognitive impairments: No apparent impairments                         Following commands: Intact      Cueing    Exercises      General Comments        Pertinent Vitals/Pain Pain Assessment Pain Assessment: 0-10 Pain Score: 3  Pain Location: R knee Pain Descriptors / Indicators: Grimacing, Guarding, Aching    Home Living Family/patient expects to be discharged to:: Private residence Living Arrangements: Alone (Spouse does not live in house however will be providing continuous assistance for the first 1-2 days and then intermittently afterwards.) Available Help at Discharge: Available PRN/intermittently;Family (Spouse does not live in house  however will be providing continuous assistance for the first 1-2 days and then intermittently afterwards.) Type of Home: House Home Access: Level entry       Home Layout: One level Home Equipment: Cane - single point Additional Comments: has standard bathroom with regular commode.  Reports he will have temporary grab bars installed    Prior Function            PT Goals (current  goals can now be found in the care plan section) Progress towards PT goals: Progressing toward goals    Frequency    BID      PT Plan      Co-evaluation              AM-PAC PT 6 Clicks Mobility   Outcome Measure  Help needed turning from your back to your side while in a flat bed without using bedrails?: A Little Help needed moving from lying on your back to sitting on the side of a flat bed without using bedrails?: A Little Help needed moving to and from a bed to a chair (including a wheelchair)?: A Little Help needed standing up from a chair using your arms (e.g., wheelchair or bedside chair)?: A Little Help needed to walk in hospital room?: A Little Help needed climbing 3-5 steps with a railing? : A Little 6 Click Score: 18    End of Session   Activity Tolerance: Patient tolerated treatment well Patient left: with call bell/phone within reach;Other (comment) (seated EOB, polar care in place)   PT Visit Diagnosis: Other abnormalities of gait and mobility (R26.89);Difficulty in walking, not elsewhere classified (R26.2);Muscle weakness (generalized) (M62.81);Pain Pain - Right/Left: Right Pain - part of body: Knee     Time: 0902-0920 PT Time Calculation (min) (ACUTE ONLY): 18 min  Charges:    $Therapeutic Activity: 8-22 mins PT General Charges $$ ACUTE PT VISIT: 1 Visit                     Doyal Shams PT, DPT 9:47 AM,03/05/24

## 2024-03-19 NOTE — Progress Notes (Signed)
 Chief Complaint: Chief Complaint  Patient presents with  . Post Operative Visit    RT TKA - 07.15.25/POGGI    Mathew Porter is a 68 y.o. male who presents today for his first postop appointment following a right total knee arthroplasty performed Dr. Edie on 03/04/2024.  The patient denies any trauma or injury affecting right knee since surgery.  The patient denies any signs of infection at home such as fevers chills or drainage from the right knee incision site.  He does report moderate soreness in the right knee, he is taking primarily Tylenol  for discomfort but will take occasional oxycodone .  He reports a throughout 10 pain score at today's visit.  He does report the other night when turning in bed he did feel sharp pain in the right knee and has had some mild increase in his discomfort since this occurred but he has not noticed any loss of range of motion that he has been gradually improving with with physical therapy he has completed home health therapy at this time and will be transition to outpatient therapy at this time.  He does still have a few more tablets of Eliquis  remaining.  He denies any catching or locking symptoms right knee.  Overall he feels that he is doing well at this time.  Past Medical History: Past Medical History:  Diagnosis Date  . ADD (attention deficit disorder)   . Anxiety   . Chickenpox   . Colon polyp   . Diabetes mellitus without complication (CMS/HHS-HCC)   . Diverticulosis   . Headache   . Hypertension     Past Surgical History: Past Surgical History:  Procedure Laterality Date  . Colon resection for diverticulitis 1991  01/21/1990   sigmoid colon resection  . COLONOSCOPY  08/26/2002   Dr. CHARM Punch @ [facility] - Hyperplastic Polyp (7mm)  . Tdap  02/2011  . COLONOSCOPY  10/01/2019   Diverticulosis/PHx CP/Repeat 41yrs/JWB  . ARTHROPLASTY TOTAL KNEE Right 03/04/2024   Dr. Edie  . COLONOSCOPY  12/01/2013, 01/21/1990   Dr. EMERSON Mariner @ [facility]  Adenomatous Polyp, 5 yrs per [facility]  . Wisdom Teeth Removed      Past Family History: Family History  Problem Relation Age of Onset  . Myocardial Infarction (Heart attack) Father   . Alcohol abuse Father     Medications: Current Outpatient Medications  Medication Sig Dispense Refill  . oxyCODONE  (ROXICODONE ) 5 MG immediate release tablet Take 5-10 mg by mouth every 4 (four) hours as needed    . aspirin -acetaminophen -caffeine  (EXCEDRIN  MIGRAINE) 250-250-65 mg per tablet Take 2 tablets by mouth every 6 (six) hours as needed for Pain.    . cholecalciferol  (VITAMIN D3) 1000 unit capsule Take 1,000 Units by mouth once daily    . cyanocobalamin  (VITAMIN B12) 1000 MCG tablet Take 1,000 mcg by mouth once daily    . glipiZIDE  (GLUCOTROL ) 2.5 MG XL tablet Take 1 tablet by mouth daily. 90 tablet 3  . ibuprofen (MOTRIN) 200 MG tablet Take 200 mg by mouth every 6 (six) hours as needed for Pain    . lamoTRIgine  (LAMICTAL  XR) 100 mg XR tablet TAKE 1 TABLET(100 MG) BY MOUTH AT BEDTIME 90 tablet 3  . metFORMIN  (GLUCOPHAGE -XR) 500 MG XR tablet TAKE 2 TABLETS(1000 MG) BY MOUTH DAILY WITH DINNER 180 tablet 3  . simvastatin  (ZOCOR ) 20 MG tablet TAKE 1 TABLET(20 MG) BY MOUTH EVERY NIGHT 90 tablet 3  . SUMAtriptan  (IMITREX ) 50 MG tablet Take 1 tablet by  mouth as directed for migraine. May take a second dose after 2 hours if needed. 10 tablet 1  . tirzepatide (MOUNJARO) 10 mg/0.5 mL pen injector Inject 0.5 mLs (10 mg total) subcutaneously once a week 6 mL 1  . verapamiL  (CALAN -SR) 240 MG SR tablet TAKE 1 TABLET(240 MG) BY MOUTH EVERY DAY 90 tablet 3   No current facility-administered medications for this visit.    Allergies: Allergies  Allergen Reactions  . Doxycycline  Itching     Review of Systems:  A comprehensive 14 point ROS was performed, reviewed by me today, and the pertinent orthopaedic findings are documented in the HPI.   Exam: BP (!) 152/70   Ht 175.3 cm (5' 9)   Wt (!) 155.6 kg (343 lb)    BMI 50.65 kg/m  General/Constitutional: The patient appears to be well-nourished, well-developed, and in no acute distress. Neuro/Psych: Normal mood and affect, oriented to person, place and time.  General: Well developed, well nourished male in no apparent distress.  Normal affect.  Normal communication.  Patient answers questions appropriately.  The patient has a moderate limp favoring the right leg, he is using a walker for assistance with ambulation at this time.  Right Lower Extremity: Examination of the right knee reveals the incision to be intact.  The existing dressing was removed.  The staples were removed with no complications.  The incision was cleaned with Betadine.  Steri Strips were applied, as well as a new dressing.  There is no erythema, warmth, and no drainage.  There is no purulence.  There is a moderate effusion.  The range of motion was full with full extension and 95 degrees of flexion.  He is able to form a straight leg raise.  Does have some weakness with quadricep testing.   There was less than 2 second capillary refill. The sensation was intact to light touch.  The patient has a negative Homan's test. Knee was stable to valgus and varus stress testing.   Imaging: None.  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  Treatment options were discussed today with the patient. 2.  The patient staples were removed today in the office and benzoin with Steri-Strips were applied.  The patient can begin to shower but will wait 1 week prior to submerging the knee incision under water.   3.  Continue working with physical therapy on range of motion and strengthening exercises for the post-op knee. 4.  Continue wearing the TED hose  stockings until 4 weeks postop.  After completion of Eliquis  transition to 81 mg aspirin  twice daily.  The patient was given a refill of oxycodone  at today's visit. 5.  The patient was educated on the need for dental prophylaxis prior to dental procedures in the future. 6.  The patient will follow-up in 4 weeks with Dr. Edie for evaluation of the knee and x-rays of the right knee.   Review of the [place] CSRS was performed in accordance of the [facility] prior to dispensing any controlled drugs.  This note was generated in part with voice recognition software and I apologize for any typographical errors that were not detected and corrected.  DOROTHA Gustavo Level, PA-C, CAQ-OS [facility]

## 2024-03-31 NOTE — Progress Notes (Signed)
 [facility]                    PT Daily Progress Note  Patient's Name:Mathew Porter                        MRN #[id number]      Date:03/31/2024  Visit Number: 5  Progress Related to Long and Short Term Goals:                                                                                            [] AROM:    [] PROM:    [] Strength:    [] Pain:    while- [] at rest    [] During activity:  []  Functional goal:    []  Other:    []  Balance:     Patient / Family Education:    []  Progressed HEP to include:  []  Reviewed previous HEP:   ___________________________________________________________________________________   Skilled Service Provided:  To: [x]  right   []  left     []  bilateral    []   core/body stability and balance  []  neck   []   shoulder   []  upper arm/  []  elbow   []  lower arm  []  wrist   []  hand   []   fingers  []  back     []  hip    []  upper leg    [x]  knee  []  lower leg   []   Ankle & Foot           Ther Ex Therapeutic Procedure CPT 97110 : 35 minutes   Manual Tx Manual Therapy CPT 97140: 10 minutes   Vaso-pneumatic compression      Ice / Heat                        Infrared         US                 [x]  Inc ROM [x] Inc joint mob [] Dec swelling [] Dec pain Setting:   [] Inc Circ  [x]  Inc Flex [x]  Inc Flex [] Dec inflam [] Dec inflam [] Inc Healing [] Dec pain  []  Inc  Bal [x]  Inc ROM [] Dec edema [] Dec swelling [] Dec pain [] Dec swelling  [x]  Inc Strength [] Astym Level      []  1        []  2      []  3 [] Inc circulation [] Inc circulation [] Dec inflam  []  Progressive  HEP           [] tension/ spasms/soft tissue mob Temp:         41 F [] Inc soft tissue mobility    []  Inc Posture [] Correct malaign       Subjective:    Pt reports back pain has resolved. Pt notes going to bed Friday night and staying in bed for 24 hours. Pt notes R knee was stiff, but loosened up again.  Treatment notes:    Therapeutic ex as per f/s in stand and seated positions.  MT for patellofemoral joint mobs, PROM and stretch in  seated position. Pt declined ice and will do so at home.   Assessment of Treatment:     Pt demonstrating good ROM; moderate discomfort at end range initially, but lessened with MT. Measure next visit.  Patient's Response to Treatment:  [] Dec pain  [] Inc pain    []  Inc flexibility    [] Inc endurance   []  Inc strength     []  Inc AROM      []  Inc PROM       []  Dec Spasms    [] Inc Posture / alignment     []  Inc Joint Mobility   []  Other:   Functional Improvement Noted:  Increased ability to  []  walk   []  stand for longer periods   []  dress with less help    []  lift    []   reach   []   bend  []  Other:  Remaining Impairment Requiring Continued Treatment:   [x]  Dec  flexibility   [x]  Dec  ROM   [x]   pain   [x]  Dec strength  [x]   weakness    [x]   Dec balance   [x]   swelling   [x]   inflammation   []   Spasm    []  posture/alignment    []   Other:  Plan: [x]   Continue Plan of Care        []   Add:         []   Discharge after next visit    []   Discharge to HEP   []  Other: Hip/Knee Treatment Log   Date   03/21/24 8/4 8/8 8/11        Bike(seat____)            Stepper (seat     ) L3  10' L3 10' L3  10' L3 10'        Elliptical             LP Plyo jumps            LP  Squats            LP  Single Leg            Standing calf raises 3x10 3x10 3x10 3x10        Mini squats 3x10 3x10 3x10 3x10        Wall slides            Stool Scoots            Step ups 4 3x10 4 3x10 4 3x10 4 3x10        Multi-Hip TKE 35 3x10 35 3x10 35 3x10 35 3x10        Multi-Hip Flex/Abd            Multi-Hip Ext            Knee Extension (Bilat)            Leg Curls (Bilat)            SAQ 3x10 3x10          FAQ  3x10          SLR (flex) 3x10 AA 3x10          Quad Sets 3x10 30x5 30x         LAQ   2# 3x10 2# 3x10        Walking Lunges            NMR: 3 point  NMR: T-band proprio            NMR: Wobble Board balance            NMR:  BOSU squats             NMR: Agility            Strap assisted heel slides 3x10 3x10          Seated marching   3x10 3x10                    Flexionator            Extensionator            Stretch-            Manual - PROM for right knee flex/ext, ant/post glides, patella mobs 15' 10' 15' 10'        Ultrasound            Infrared            HP/CP with/ without  IFC  CP 10'  PD        Vasopneumatic Level 1/2/3              PT Billing Documentation Date of Onset: 03/04/24 Visit Number: 5       Frequently Used Timed Codes Therapeutic Procedure CPT 97110 : 35 minutes Manual Therapy CPT 97140: 10 minutes               Total Treatment Time: 45 minutes Attestation Statement:   I personally performed the service, incident to.  (I2)      Therapist's : HEIDI BARNES, PTA

## 2024-04-02 ENCOUNTER — Inpatient Hospital Stay

## 2024-04-02 ENCOUNTER — Emergency Department

## 2024-04-02 ENCOUNTER — Inpatient Hospital Stay
Admission: EM | Admit: 2024-04-02 | Discharge: 2024-04-08 | DRG: 908 | Disposition: A | Discharge status: Skilled Nursing Facility | Attending: Internal Medicine | Admitting: Internal Medicine

## 2024-04-02 DIAGNOSIS — Z87891 Personal history of nicotine dependence: Secondary | ICD-10-CM | POA: Diagnosis not present

## 2024-04-02 DIAGNOSIS — Z23 Encounter for immunization: Secondary | ICD-10-CM

## 2024-04-02 DIAGNOSIS — Z7984 Long term (current) use of oral hypoglycemic drugs: Secondary | ICD-10-CM | POA: Diagnosis not present

## 2024-04-02 DIAGNOSIS — Z7901 Long term (current) use of anticoagulants: Secondary | ICD-10-CM

## 2024-04-02 DIAGNOSIS — S76119A Strain of unspecified quadriceps muscle, fascia and tendon, initial encounter: Secondary | ICD-10-CM | POA: Diagnosis present

## 2024-04-02 DIAGNOSIS — G43909 Migraine, unspecified, not intractable, without status migrainosus: Secondary | ICD-10-CM | POA: Diagnosis present

## 2024-04-02 DIAGNOSIS — Z96651 Presence of right artificial knee joint: Secondary | ICD-10-CM | POA: Diagnosis present

## 2024-04-02 DIAGNOSIS — Z801 Family history of malignant neoplasm of trachea, bronchus and lung: Secondary | ICD-10-CM

## 2024-04-02 DIAGNOSIS — I10 Essential (primary) hypertension: Secondary | ICD-10-CM | POA: Diagnosis present

## 2024-04-02 DIAGNOSIS — Z79899 Other long term (current) drug therapy: Secondary | ICD-10-CM | POA: Diagnosis not present

## 2024-04-02 DIAGNOSIS — M9689 Other intraoperative and postprocedural complications and disorders of the musculoskeletal system: Principal | ICD-10-CM | POA: Diagnosis present

## 2024-04-02 DIAGNOSIS — G4733 Obstructive sleep apnea (adult) (pediatric): Secondary | ICD-10-CM | POA: Diagnosis present

## 2024-04-02 DIAGNOSIS — Z6841 Body Mass Index (BMI) 40.0 and over, adult: Secondary | ICD-10-CM | POA: Diagnosis not present

## 2024-04-02 DIAGNOSIS — Z881 Allergy status to other antibiotic agents status: Secondary | ICD-10-CM

## 2024-04-02 DIAGNOSIS — G40909 Epilepsy, unspecified, not intractable, without status epilepticus: Secondary | ICD-10-CM | POA: Diagnosis present

## 2024-04-02 DIAGNOSIS — W010XXA Fall on same level from slipping, tripping and stumbling without subsequent striking against object, initial encounter: Secondary | ICD-10-CM | POA: Diagnosis present

## 2024-04-02 DIAGNOSIS — Z9889 Other specified postprocedural states: Secondary | ICD-10-CM

## 2024-04-02 DIAGNOSIS — F419 Anxiety disorder, unspecified: Secondary | ICD-10-CM | POA: Diagnosis present

## 2024-04-02 DIAGNOSIS — E66813 Obesity, class 3: Secondary | ICD-10-CM | POA: Diagnosis present

## 2024-04-02 DIAGNOSIS — D649 Anemia, unspecified: Secondary | ICD-10-CM | POA: Diagnosis present

## 2024-04-02 DIAGNOSIS — N401 Enlarged prostate with lower urinary tract symptoms: Secondary | ICD-10-CM | POA: Diagnosis present

## 2024-04-02 DIAGNOSIS — E785 Hyperlipidemia, unspecified: Secondary | ICD-10-CM | POA: Diagnosis present

## 2024-04-02 DIAGNOSIS — D72829 Elevated white blood cell count, unspecified: Secondary | ICD-10-CM | POA: Diagnosis present

## 2024-04-02 DIAGNOSIS — Z751 Person awaiting admission to adequate facility elsewhere: Secondary | ICD-10-CM | POA: Diagnosis not present

## 2024-04-02 DIAGNOSIS — J45909 Unspecified asthma, uncomplicated: Secondary | ICD-10-CM | POA: Diagnosis present

## 2024-04-02 DIAGNOSIS — K5900 Constipation, unspecified: Secondary | ICD-10-CM | POA: Diagnosis not present

## 2024-04-02 DIAGNOSIS — E119 Type 2 diabetes mellitus without complications: Secondary | ICD-10-CM | POA: Diagnosis present

## 2024-04-02 DIAGNOSIS — R000 Tachycardia, unspecified: Secondary | ICD-10-CM | POA: Diagnosis present

## 2024-04-02 DIAGNOSIS — S76111A Strain of right quadriceps muscle, fascia and tendon, initial encounter: Principal | ICD-10-CM | POA: Diagnosis present

## 2024-04-02 LAB — CBC WITH DIFFERENTIAL/PLATELET
Abs Immature Granulocytes: 0.05 K/uL (ref 0.00–0.07)
Basophils Absolute: 0 K/uL (ref 0.0–0.1)
Basophils Relative: 0 %
Eosinophils Absolute: 0 K/uL (ref 0.0–0.5)
Eosinophils Relative: 0 %
HCT: 43.8 % (ref 39.0–52.0)
Hemoglobin: 14.3 g/dL (ref 13.0–17.0)
Immature Granulocytes: 0 %
Lymphocytes Relative: 11 %
Lymphs Abs: 1.5 K/uL (ref 0.7–4.0)
MCH: 29.7 pg (ref 26.0–34.0)
MCHC: 32.6 g/dL (ref 30.0–36.0)
MCV: 90.9 fL (ref 80.0–100.0)
Monocytes Absolute: 1.1 K/uL — ABNORMAL HIGH (ref 0.1–1.0)
Monocytes Relative: 8 %
Neutro Abs: 10.9 K/uL — ABNORMAL HIGH (ref 1.7–7.7)
Neutrophils Relative %: 81 %
Platelets: 396 K/uL (ref 150–400)
RBC: 4.82 MIL/uL (ref 4.22–5.81)
RDW: 14.6 % (ref 11.5–15.5)
WBC: 13.7 K/uL — ABNORMAL HIGH (ref 4.0–10.5)
nRBC: 0 % (ref 0.0–0.2)

## 2024-04-02 LAB — COMPREHENSIVE METABOLIC PANEL WITH GFR
ALT: 21 U/L (ref 0–44)
AST: 22 U/L (ref 15–41)
Albumin: 3.8 g/dL (ref 3.5–5.0)
Alkaline Phosphatase: 82 U/L (ref 38–126)
Anion gap: 12 (ref 5–15)
BUN: 15 mg/dL (ref 8–23)
CO2: 21 mmol/L — ABNORMAL LOW (ref 22–32)
Calcium: 9.1 mg/dL (ref 8.9–10.3)
Chloride: 101 mmol/L (ref 98–111)
Creatinine, Ser: 0.85 mg/dL (ref 0.61–1.24)
GFR, Estimated: >60 mL/min (ref >60)
Glucose, Bld: 164 mg/dL — ABNORMAL HIGH (ref 70–99)
Potassium: 4.3 mmol/L (ref 3.5–5.1)
Sodium: 134 mmol/L — ABNORMAL LOW (ref 135–145)
Total Bilirubin: 0.8 mg/dL (ref 0.0–1.2)
Total Protein: 7.6 g/dL (ref 6.5–8.1)

## 2024-04-02 LAB — PROCALCITONIN: Procalcitonin: <0.1 ng/mL

## 2024-04-02 LAB — GLUCOSE, CAPILLARY: Glucose-Capillary: 141 mg/dL — ABNORMAL HIGH (ref 70–99)

## 2024-04-02 LAB — LACTIC ACID, PLASMA: Lactic Acid, Venous: 2.7 mmol/L (ref 0.5–1.9)

## 2024-04-02 MED ORDER — SIMVASTATIN 20 MG PO TABS
20.0000 mg | ORAL_TABLET | Freq: Every day | ORAL | Status: DC
  Administered 2024-04-03 – 2024-04-07 (×5): 20 mg via ORAL
  Filled 2024-04-02 (×6): qty 1

## 2024-04-02 MED ORDER — ACETAMINOPHEN 325 MG PO TABS
650.0000 mg | ORAL_TABLET | Freq: Four times a day (QID) | ORAL | Status: DC | PRN
  Administered 2024-04-03: 650 mg via ORAL
  Filled 2024-04-02: qty 2

## 2024-04-02 MED ORDER — ACETAMINOPHEN 650 MG RE SUPP: 650.0000 mg | Freq: Four times a day (QID) | RECTAL | Status: DC | PRN

## 2024-04-02 MED ORDER — KETOROLAC TROMETHAMINE 30 MG/ML IJ SOLN: 30.0000 mg | Freq: Once | INTRAMUSCULAR | Status: DC

## 2024-04-02 MED ORDER — SODIUM CHLORIDE 0.9 % IV SOLN: INTRAVENOUS | Status: DC

## 2024-04-02 MED ORDER — HYDROMORPHONE HCL 1 MG/ML IJ SOLN: 0.5000 mg | INTRAMUSCULAR | Status: DC | PRN

## 2024-04-02 MED ORDER — ONDANSETRON HCL 4 MG/2ML IJ SOLN: 4.0000 mg | Freq: Four times a day (QID) | INTRAMUSCULAR | Status: DC | PRN

## 2024-04-02 MED ORDER — ALBUTEROL SULFATE (2.5 MG/3ML) 0.083% IN NEBU
2.5000 mg | INHALATION_SOLUTION | RESPIRATORY_TRACT | Status: AC | PRN
Start: 2024-04-02 — End: ?

## 2024-04-02 MED ORDER — ONDANSETRON HCL 4 MG PO TABS: 4.0000 mg | ORAL_TABLET | Freq: Four times a day (QID) | ORAL | Status: DC | PRN

## 2024-04-02 MED ORDER — VERAPAMIL HCL ER 240 MG PO TBCR
240.0000 mg | EXTENDED_RELEASE_TABLET | Freq: Every evening | ORAL | Status: DC
  Administered 2024-04-02 – 2024-04-07 (×6): 240 mg via ORAL
  Filled 2024-04-02 (×8): qty 1

## 2024-04-02 MED ORDER — INSULIN ASPART 100 UNIT/ML IJ SOLN: 0.0000 [IU] | Freq: Three times a day (TID) | INTRAMUSCULAR | Status: DC

## 2024-04-02 MED ORDER — MORPHINE SULFATE (PF) 4 MG/ML IV SOLN
4.0000 mg | Freq: Once | INTRAVENOUS | Status: DC
  Filled 2024-04-02: qty 1

## 2024-04-02 MED ORDER — KETOROLAC TROMETHAMINE 30 MG/ML IJ SOLN
60.0000 mg | Freq: Once | INTRAMUSCULAR | Status: AC
  Administered 2024-04-02 (×2): 60 mg via INTRAVENOUS
  Filled 2024-04-02: qty 2

## 2024-04-02 MED ORDER — OXYCODONE HCL 5 MG PO TABS
5.0000 mg | ORAL_TABLET | ORAL | Status: DC | PRN
  Administered 2024-04-04 – 2024-04-06 (×7): 10 mg via ORAL
  Filled 2024-04-02 (×8): qty 2

## 2024-04-02 NOTE — H&P (Addendum)
 History and Physical    Mathew Porter FMW:[id number] DOB: Jul 13, 1956 DOA: 04/02/2024  PCP: Valora Agent, MD  Patient coming from: home  I have personally briefly reviewed patient's old medical records in [facility]  Chief Complaint: fall injury to right knee  HPI: Mathew Porter is a 68 y.o. male with medical history significant of Anemia, Anxiety, Asthma, DmII, HA , HTN,obesity, OSA, in addition to interim hx of R TKA Dr. Edie  on 03/04/2024.  Patient presents to ED s/p mechanical fall with injury to right Knee. Patient notes no other injury. Denies head strike. Patient on ros noted no n/v/d/sob/chest pain / abdominal pain / fever/ or chills. Notes he was at his baseline prior to fall. Patient states he believes he slipped while walking. He notes is pain is a 2 at rest but increase to a max of 10 with  movement.   ED Course:  Afeb, 97.7 , BP 120/101 ( repeat pending)  hr 116  Knee right IMPRESSION: 1. No acute fracture of the right femur. 2. Intact knee arthroplasty. 3. Soft tissue edema about the distal thigh and knee.   Wbc 13.7, plt 396, hgb 14.3 Na 134, 4.3, Cl 101, bicarb 21, glu 164, cr 0,.84 EKG: sinus tachycardia   Patient seen by Dr Tobie of ortho , patient dx with quadricep rupture  With plans for repair in am.    Review of Systems: As per HPI otherwise 10 point review of systems negative.   Past Medical History:  Diagnosis Date   Anemia    Anxiety    Asthma    Cellulitis    Diabetes mellitus without complication (HCC)    Dyspnea    Headache    HTN (hypertension)    Obesity    Pneumonia    Primary osteoarthritis of right knee    Pyogenic granuloma of skin    Sleep apnea     Past Surgical History:  Procedure Laterality Date   COLON SURGERY  1994   Diverticulitis   COLONOSCOPY WITH PROPOFOL  N/A 10/01/2019   Procedure: COLONOSCOPY WITH PROPOFOL ;  Surgeon: Dessa Reyes ORN, MD;  Location: [facility] ENDOSCOPY;  Service: Endoscopy;  Laterality:  N/A;   HYDROCELE EXCISION / REPAIR  2009   TOTAL KNEE ARTHROPLASTY Right 03/04/2024   Procedure: ARTHROPLASTY, KNEE, TOTAL;  Surgeon: Edie Norleen PARAS, MD;  Location: [facility] ORS;  Service: Orthopedics;  Laterality: Right;   VASECTOMY  2009     reports that he quit smoking about 9 years ago. His smoking use included cigarettes. He started smoking about 49 years ago. He has never used smokeless tobacco. He reports that he does not currently use alcohol. He reports that he does not use drugs.  Allergies  Allergen Reactions   Doxycycline  Hives and Itching    Family History  Problem Relation Age of Onset   Lung cancer Father     Prior to Admission medications   Medication Sig Start Date End Date Taking? Authorizing Provider  apixaban  (ELIQUIS ) 2.5 MG TABS tablet Take 1 tablet (2.5 mg total) by mouth 2 (two) times daily. 03/05/24   Poggi, Norleen PARAS, MD  aspirin -acetaminophen -caffeine  (EXCEDRIN  MIGRAINE) 250-250-65 MG tablet Take 2 tablets by mouth every 6 (six) hours as needed for headache.    [provider]  chlorhexidine  (HIBICLENS ) 4 % external liquid Apply 15 mLs (1 Application total) topically as directed for 30 doses. Use as directed daily for 5 days every other week for 6 weeks. 03/04/24  Poggi, Norleen PARAS, MD  chlorhexidine  (HIBICLENS ) 4 % external liquid Apply 15 mLs (1 Application total) topically as directed for 30 doses. Use as directed daily for 5 days every other week for 6 weeks. 03/04/24   Poggi, Norleen PARAS, MD  cholecalciferol  (VITAMIN D3) 25 MCG (1000 UNIT) tablet Take 1,000 Units by mouth daily.    [provider]  cyanocobalamin  (VITAMIN B12) 1000 MCG tablet Take 1,000 mcg by mouth daily.    [provider]  glipiZIDE  (GLUCOTROL  XL) 2.5 MG 24 hr tablet Take 2.5 mg by mouth daily. 01/15/24   [provider]  lamoTRIgine  (LAMICTAL  XR) 100 MG 24 hour tablet Take 100 mg by mouth at bedtime. 12/05/23   [provider]  metFORMIN  (GLUCOPHAGE -XR) 500 MG 24  hr tablet Take 1,000 mg by mouth daily with supper. 06/11/23   [provider]  MOUNJARO 10 MG/0.5ML Pen Inject 10 mg into the skin every Friday. 02/07/24   [provider]  mupirocin  ointment (BACTROBAN ) 2 % Place 1 Application into the nose 2 (two) times daily for 60 doses. Use as directed 2 times daily for 5 days every other week for 6 weeks. 03/04/24 04/03/24  Poggi, Norleen PARAS, MD  mupirocin  ointment (BACTROBAN ) 2 % Place 1 Application into the nose 2 (two) times daily for 60 doses. Use as directed 2 times daily for 5 days every other week for 6 weeks. 03/04/24 04/03/24  Poggi, Norleen PARAS, MD  ondansetron  (ZOFRAN -ODT) 4 MG disintegrating tablet Take 1 tablet (4 mg total) by mouth every 8 (eight) hours as needed for nausea or vomiting. 03/04/24   Poggi, Norleen PARAS, MD  oxyCODONE  (ROXICODONE ) 5 MG immediate release tablet Take 1-2 tablets (5-10 mg total) by mouth every 4 (four) hours as needed for moderate pain (pain score 4-6) or severe pain (pain score 7-10). 03/04/24   Poggi, Norleen PARAS, MD  simvastatin  (ZOCOR ) 20 MG tablet Take 20 mg by mouth at bedtime. 10/15/17   [provider]  SUMAtriptan  (IMITREX ) 50 MG tablet Take 1 tablet (50 mg total) by mouth 3 (three) times daily as needed for migraine or headache. May repeat in 2 hours if headache persists or recurs. 11/08/18   Vachhani, Vaibhavkumar, MD  verapamil  (CALAN -SR) 240 MG CR tablet Take 240 mg by mouth every evening. 10/15/17   [provider]    Physical Exam: Vitals:   04/02/24 1300 04/02/24 1301  BP:  (!) 120/101  Pulse:  (!) 116  Resp: 16   Temp: 97.7 F (36.5 C)   TempSrc: Oral   Weight: (!) 152 kg     Constitutional: NAD, calm, comfortable Vitals:   04/02/24 1300 04/02/24 1301  BP:  (!) 120/101  Pulse:  (!) 116  Resp: 16   Temp: 97.7 F (36.5 C)   TempSrc: Oral   Weight: (!) 152 kg    Eyes: PERRL, lids and conjunctivae normal ENMT: Mucous membranes are moist. Posterior pharynx clear of any exudate or  lesions.Normal dentition.  Neck: normal, supple, no masses, no thyromegaly Respiratory: clear to auscultation bilaterally, no wheezing, no crackles. Normal respiratory effort. No accessory muscle use.  Cardiovascular: Regular rate and rhythm, no murmurs / rubs / gallops. No extremity edema. 2+ pedal pulses. Abdomen: no tenderness, no masses palpated. No hepatosplenomegaly. Bowel sounds positive.  Musculoskeletal: no clubbing / cyanosis.swollen right lower extremity decrease rom due to pain  no contractures. Normal muscle tone.  Skin: no rashes, lesions, ulcers. No induration Neurologic: CN  grossly intact. Sensation  intact, MAE X4 Psychiatric: Normal judgment and insight. Alert and oriented x 3. Normal mood.    Labs on Admission: I have personally reviewed following labs and imaging studies  CBC: Recent Labs  Lab 04/02/24 1645  WBC 13.7*  NEUTROABS 10.9*  HGB 14.3  HCT 43.8  MCV 90.9  PLT 396   Basic Metabolic Panel: Recent Labs  Lab 04/02/24 1645  NA 134*  K 4.3  CL 101  CO2 21*  GLUCOSE 164*  BUN 15  CREATININE 0.85  CALCIUM  9.1   GFR: Estimated Creatinine Clearance: 125.6 mL/min (by C-G formula based on SCr of 0.85 mg/dL). Liver Function Tests: Recent Labs  Lab 04/02/24 1645  AST 22  ALT 21  ALKPHOS 82  BILITOT 0.8  PROT 7.6  ALBUMIN 3.8   No results for input(s): LIPASE, AMYLASE in the last 168 hours. No results for input(s): AMMONIA in the last 168 hours. Coagulation Profile: No results for input(s): INR, PROTIME in the last 168 hours. Cardiac Enzymes: No results for input(s): CKTOTAL, CKMB, CKMBINDEX, TROPONINI in the last 168 hours. BNP (last 3 results) No results for input(s): PROBNP in the last 8760 hours. HbA1C: No results for input(s): HGBA1C in the last 72 hours. CBG: No results for input(s): GLUCAP in the last 168 hours. Lipid Profile: No results for input(s): CHOL, HDL, LDLCALC, TRIG, CHOLHDL, LDLDIRECT  in the last 72 hours. Thyroid Function Tests: No results for input(s): TSH, T4TOTAL, FREET4, T3FREE, THYROIDAB in the last 72 hours. Anemia Panel: No results for input(s): VITAMINB12, FOLATE, FERRITIN, TIBC, IRON, RETICCTPCT in the last 72 hours. Urine analysis:    Component Value Date/Time   COLORURINE YELLOW (A) 02/21/2024 1356   APPEARANCEUR CLEAR (A) 02/21/2024 1356   APPEARANCEUR Clear 05/23/2018 1043   LABSPEC 1.020 02/21/2024 1356   PHURINE 5.0 02/21/2024 1356   GLUCOSEU NEGATIVE 02/21/2024 1356   HGBUR NEGATIVE 02/21/2024 1356   BILIRUBINUR NEGATIVE 02/21/2024 1356   BILIRUBINUR Negative 05/23/2018 1043   KETONESUR NEGATIVE 02/21/2024 1356   PROTEINUR NEGATIVE 02/21/2024 1356   NITRITE NEGATIVE 02/21/2024 1356   LEUKOCYTESUR NEGATIVE 02/21/2024 1356    Radiological Exams on Admission: DG Femur Min 2 Views Right Result Date: 04/02/2024 CLINICAL DATA:  Pain after fall. Right femur pain. Fell at home. Knee replacement 03/04/24 EXAM: RIGHT FEMUR 2 VIEWS COMPARISON:  None Available. FINDINGS: No acute fracture. Knee arthroplasty is intact. No periprosthetic lucency. Hip alignment is maintained. There is soft tissue edema about the distal thigh and knee. IMPRESSION: 1. No acute fracture of the right femur. 2. Intact knee arthroplasty. 3. Soft tissue edema about the distal thigh and knee. Electronically Signed   By: Andrea Gasman M.D.   On: 04/02/2024 14:08    EKG: Independently reviewed.   Assessment/Plan   Quadriceps tendon rupture  - admit to med surg - supportive care pain medication  -immobilization / RICE  -npo midnight  -f/u on ortho recs  -hold eliquis  placed on for post procedure ppx    Leukocytosis - no fever/chills or infectious complaints - will check ua/ cxr/ -continue to monitor   Anemia -stable   Seizure d/o -continue lamictal    HLD -continue simvasatin   Hx of Migraine  - prn triptan   HTN -stable  -continue  verapamil     Anxiety -not on medications at this time    Asthma -no acute exacerbation  -prn nebs   DmII -iss/fs  -hold oral hypoglycemic while npo    Obesity Class III   OSA -  at bedtime O2/cpap  DVT prophylaxis: scd Code Status: full/ as discussed per patient wishes in event of cardiac arrest  Family Communication: none at bedside Disposition Plan: patient  expected to be admitted greater than 2 midnights  Consults called: DR Tobie Admission status: med surg   Camila DELENA Ned MD [facility]  If 7PM-7AM, please contact night-coverage [email or web address] Password [email or web address]  04/02/2024, 5:43 PM

## 2024-04-02 NOTE — ED Notes (Signed)
 CCMD called to set up cardiac monitoring.

## 2024-04-02 NOTE — Group Note (Deleted)
 Date:  04/02/2024 Time:  2:22 PM  Group Topic/Focus:  Wellness Toolbox:   The focus of this group is to discuss various aspects of wellness, balancing those aspects and exploring ways to increase the ability to experience wellness.  Patients will create a wellness toolbox for use upon discharge.     Participation Level:  {BHH PARTICIPATION OZCZO:77735}  Participation Quality:  {BHH PARTICIPATION QUALITY:22265}  Affect:  {BHH AFFECT:22266}  Cognitive:  {BHH COGNITIVE:22267}  Insight: {BHH Insight2:20797}  Engagement in Group:  {BHH ENGAGEMENT IN HMNLE:77731}  Modes of Intervention:  {BHH MODES OF INTERVENTION:22269}  Additional Comments:  ***  Myra Curtistine BROCKS 04/02/2024, 2:22 PM

## 2024-04-02 NOTE — Consult Note (Signed)
 ORTHOPAEDIC CONSULTATION  REQUESTING PHYSICIAN: Suzanne Kirsch, MD  Chief Complaint:   Right knee pain  History of Present Illness: Mathew Porter is a 68 y.o. male with a past medical history significant for diabetes, history of seizure-like activity, BPH who is also s/p right TKA by Dr. Edie  on 03/04/2024.  He had a fall earlier today and feels that he awkwardly twisted his knee and has had significantly increased pain and swelling about the knee since that time.  He has had difficulty straightening his knee and lifting his leg.  Prior to this injury, he states that he felt like he was making good progress with physical therapy and was able to ambulate relatively well.  Past Medical History:  Diagnosis Date   Anemia    Anxiety    Asthma    Cellulitis    Diabetes mellitus without complication (HCC)    Dyspnea    Headache    HTN (hypertension)    Obesity    Pneumonia    Primary osteoarthritis of right knee    Pyogenic granuloma of skin    Sleep apnea    Past Surgical History:  Procedure Laterality Date   COLON SURGERY  1994   Diverticulitis   COLONOSCOPY WITH PROPOFOL  N/A 10/01/2019   Procedure: COLONOSCOPY WITH PROPOFOL ;  Surgeon: Dessa Reyes ORN, MD;  Location: [facility];  Service: Endoscopy;  Laterality: N/A;   HYDROCELE EXCISION / REPAIR  2009   TOTAL KNEE ARTHROPLASTY Right 03/04/2024   Procedure: ARTHROPLASTY, KNEE, TOTAL;  Surgeon: Edie Norleen PARAS, MD;  Location: [facility] ORS;  Service: Orthopedics;  Laterality: Right;   VASECTOMY  2009   Social History   Socioeconomic History   Marital status: Married    Spouse name: CRICENTI,SORAYA H (Spouse)   Number of children: Not on file   Years of education: Not on file   Highest education level: Not on file  Occupational History   Not on file  Tobacco Use   Smoking status: Former    Current packs/day: 0.00    Types: Cigarettes    Start date: 08/21/1974     Quit date: 08/21/2014    Years since quitting: 9.6   Smokeless tobacco: Never  Vaping Use   Vaping status: Never Used  Substance and Sexual Activity   Alcohol use: Not Currently   Drug use: Never   Sexual activity: Yes  Other Topics Concern   Not on file  Social History Narrative   Lives alone   Social Drivers of Health   Financial Resource Strain: Low Risk  (03/19/2024)   Received from [facility]   Overall Financial Resource Strain (CARDIA)    Difficulty of Paying Living Expenses: Not hard at all  Food Insecurity: No Food Insecurity (03/19/2024)   Received from [facility]   Hunger Vital Sign    Within the past 12 months, you worried that your food would run out before you got the money to buy more.: Never true    Within the past 12 months, the food you bought just didn't last and you didn't have money to get more.: Never true  Transportation Needs: No Transportation Needs (03/19/2024)   Received from [facility] - Transportation    In the past 12 months, has lack of transportation kept you from medical appointments or from getting medications?: No    Lack of Transportation (Non-Medical): No  Physical Activity: Not on file  Stress: Not on file  Social Connections: Moderately Isolated (03/04/2024)   Social Connection and Isolation Panel    Frequency of Communication with Friends and Family: Three times a week    Frequency of Social Gatherings with Friends and Family: Never    Attends Religious Services: Never    Database administrator or Organizations: No    Attends Engineer, structural: Never    Marital Status: Married   Family History  Problem Relation Age of Onset   Lung cancer Father    Allergies  Allergen Reactions   Doxycycline  Hives and Itching   Prior to Admission medications   Medication Sig Start Date End Date Taking? Authorizing Provider  apixaban  (ELIQUIS ) 2.5 MG TABS tablet Take 1  tablet (2.5 mg total) by mouth 2 (two) times daily. 03/05/24   Poggi, Norleen PARAS, MD  aspirin -acetaminophen -caffeine  (EXCEDRIN  MIGRAINE) 250-250-65 MG tablet Take 2 tablets by mouth every 6 (six) hours as needed for headache.    [provider]  chlorhexidine  (HIBICLENS ) 4 % external liquid Apply 15 mLs (1 Application total) topically as directed for 30 doses. Use as directed daily for 5 days every other week for 6 weeks. 03/04/24   Poggi, Norleen PARAS, MD  chlorhexidine  (HIBICLENS ) 4 % external liquid Apply 15 mLs (1 Application total) topically as directed for 30 doses. Use as directed daily for 5 days every other week for 6 weeks. 03/04/24   Poggi, Norleen PARAS, MD  cholecalciferol  (VITAMIN D3) 25 MCG (1000 UNIT) tablet Take 1,000 Units by mouth daily.    [provider]  cyanocobalamin  (VITAMIN B12) 1000 MCG tablet Take 1,000 mcg by mouth daily.    [provider]  glipiZIDE  (GLUCOTROL  XL) 2.5 MG 24 hr tablet Take 2.5 mg by mouth daily. 01/15/24   [provider]  lamoTRIgine  (LAMICTAL  XR) 100 MG 24 hour tablet Take 100 mg by mouth at bedtime. 12/05/23   [provider]  metFORMIN  (GLUCOPHAGE -XR) 500 MG 24 hr tablet Take 1,000 mg by mouth daily with supper. 06/11/23   [provider]  MOUNJARO 10 MG/0.5ML Pen Inject 10 mg into the skin every Friday. 02/07/24   [provider]  mupirocin  ointment (BACTROBAN ) 2 % Place 1 Application into the nose 2 (two) times daily for 60 doses. Use as directed 2 times daily for 5 days every other week for 6 weeks. 03/04/24 04/03/24  Poggi, Norleen PARAS, MD  mupirocin  ointment (BACTROBAN ) 2 % Place 1 Application into the nose 2 (two) times daily for 60 doses. Use as directed 2 times daily for 5 days every other week for 6 weeks. 03/04/24 04/03/24  Poggi, Norleen PARAS, MD  ondansetron  (ZOFRAN -ODT) 4 MG disintegrating tablet Take 1 tablet (4 mg total) by mouth every 8 (eight) hours as needed for nausea or vomiting. 03/04/24   Poggi, Norleen PARAS, MD   oxyCODONE  (ROXICODONE ) 5 MG immediate release tablet Take 1-2 tablets (5-10 mg total) by mouth every 4 (four) hours as needed for moderate pain (pain score 4-6) or severe pain (pain score 7-10). 03/04/24   Poggi, Norleen PARAS, MD  simvastatin  (ZOCOR ) 20 MG tablet Take 20 mg by mouth at bedtime. 10/15/17   [provider]  SUMAtriptan  (IMITREX ) 50 MG tablet Take 1 tablet (50 mg total) by mouth 3 (three) times daily as needed for migraine or headache. May repeat in 2 hours if headache persists or recurs. 11/08/18   Vachhani, Vaibhavkumar, MD  verapamil  (CALAN -SR) 240 MG CR tablet Take 240 mg by mouth every evening. 10/15/17  [provider]   No results for input(s): WBC, HGB, HCT, PLT, K, CL, CO2, BUN, CREATININE, GLUCOSE, CALCIUM , LABPT, INR in the last 72 hours. DG Femur Min 2 Views Right Result Date: 04/02/2024 CLINICAL DATA:  Pain after fall. Right femur pain. Fell at home. Knee replacement 03/04/24 EXAM: RIGHT FEMUR 2 VIEWS COMPARISON:  None Available. FINDINGS: No acute fracture. Knee arthroplasty is intact. No periprosthetic lucency. Hip alignment is maintained. There is soft tissue edema about the distal thigh and knee. IMPRESSION: 1. No acute fracture of the right femur. 2. Intact knee arthroplasty. 3. Soft tissue edema about the distal thigh and knee. Electronically Signed   By: Andrea Gasman M.D.   On: 04/02/2024 14:08     Positive ROS: All other systems have been reviewed and were otherwise negative with the exception of those mentioned in the HPI and as above.  Physical Exam: BP (!) 120/101   Pulse (!) 116   Temp 97.7 F (36.5 C) (Oral)   Resp 16   Wt (!) 152 kg   BMI 47.40 kg/m  General:  Alert, no acute distress Psychiatric:  Patient is competent for consent with normal mood and affect     Orthopedic Exam:  RLE: 5/5 DF/PF/EHL SILT grossly over foot Foot wwp Midline incision appears to be healing well without erythema or  drainage Significant soft tissue swelling about the distal thigh Palpable defect superior to the patella Patient unable to perform straight leg raise   Imaging:  As above: No fractures or dislocations noted.  Total knee arthroplasty implants appear to be in an appropriate position.  There does appear to be some patella baja and a possible avulsion of the superior patella consistent with clinical concern for quadriceps tendon rupture.  Assessment/Plan: 68 year old male who is s/p right TKA on 03/04/2024.  Patient had a fall and now appears to have a quadriceps tendon rupture. 1.  I discussed the likely diagnosis with the patient as well as various treatment options.  I have also discussed this patient's case with Dr. Edie.  Recommendation was to have the patient admitted for quadriceps tendon repair tomorrow, 04/03/2024.  Dr. Edie to formally evaluate the patient tomorrow morning 2.  N.p.o. after midnight.  Hold anticoagulation in advance of surgery. 3.  Recommend admission to hospitalist team.   Earnestine Blanch   04/02/2024 4:07 PM

## 2024-04-02 NOTE — ED Provider Triage Note (Signed)
 Emergency Medicine Provider Triage Evaluation Note  Mathew Porter , a 68 y.o. male  was evaluated in triage.  Pt complains of right upper leg and knee pain after mechanical, nonsyncopal fall. Knee replacement in July.   Physical Exam  Wt (!) 152 kg   BMI 47.40 kg/m  Gen:   Awake, no distress   Resp:  Normal effort  MSK:   Moves extremities without difficulty  Other:    Medical Decision Making  Medically screening exam initiated at 1:00 PM.  Appropriate orders placed.  [name] was informed that the remainder of the evaluation will be completed by another provider, this initial triage assessment does not replace that evaluation, and the importance of remaining in the ED until their evaluation is complete.    Herlinda Kirk NOVAK, FNP 04/02/24 [phone]

## 2024-04-02 NOTE — ED Provider Notes (Signed)
 [facility] Provider Note    Event Date/Time   First MD Initiated Contact with Patient 04/02/24 1525     (approximate)   History   No chief complaint on file.    HPI  Mathew Porter is a 68 y.o. male    with a past medical history of right total knee replacement, diabetes type 2, head tremor, seizure-like activity, vertigo, BPH with obstruction who presents to the ED complaining of right knee pain   . According to the patient, morning at 11 AM mechanical fall, he fell on his back, patient denies loss of consciousness.  He is right knee went backwards.  Patient is not taking any blood thinners, patient have total knee replacement on March 04, 2024 by Dr. Edie.  After fall patient is in intense pain on his right thigh, unable to extend her right knee, able to bear weight but with intense pain.     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, right 03/04/2024   Lymphedema 01/31/2019   Diabetes (HCC) 01/28/2019   Swelling of limb 01/28/2019   Left leg cellulitis 11/05/2018   Anxiety 05/23/2018   Chickenpox 05/23/2018   Colon polyp 05/23/2018   Diverticulosis 05/23/2018   Headache 05/23/2018   Hypertension 05/23/2018   OAB (overactive bladder) 05/23/2018   Morbid obesity with BMI of 40.0-44.9, adult (HCC) 12/21/2015     ROS: Patient currently denies any vision changes, tinnitus, difficulty speaking, facial droop, sore throat, chest pain, shortness of breath, abdominal pain, nausea/vomiting/diarrhea, dysuria, or weakness/numbness/paresthesias in any extremity   Physical Exam   Triage Vital Signs: ED Triage Vitals  Encounter Vitals Group     BP 04/02/24 1301 (!) 120/101     Girls Systolic BP Percentile --      Girls Diastolic BP Percentile --      Boys Systolic BP Percentile --      Boys Diastolic BP Percentile --      Pulse Rate 04/02/24 1301  (!) 116     Resp 04/02/24 1300 16     Temp 04/02/24 1300 97.7 F (36.5 C)     Temp Source 04/02/24 1300 Oral     SpO2 --      Weight 04/02/24 1300 (!) 335 lb 1.6 oz (152 kg)     Height --      Head Circumference --      Peak Flow --      Pain Score 04/02/24 1300 3     Pain Loc --      Pain Education --      Exclude from Growth Chart --     Most recent vital signs: Vitals:   04/02/24 1300 04/02/24 1301  BP:  (!) 120/101  Pulse:  (!) 116  Resp: 16   Temp: 97.7 F (36.5 C)      Physical Exam Vitals and nursing note reviewed.  During triage patient was tachycardic, hypertensive  Constitutional:      General: Awake and alert. No acute distress.    Appearance: Normal appearance. The patient is normal weight.      Able to speak in complete sentences without cough or dyspnea  HENT:     Head: Normocephalic and atraumatic.     Mouth: Mucous membranes are moist.  Eyes:     General: PERRL. Normal EOMs  Conjunctiva/sclera: Conjunctivae normal.  Nose No congestion/rhinorrhea  CV:                  Good peripheral perfusion.  Regular rate and rhythm  Resp:               Normal effort.  Equal breath sounds bilaterally.  Abd:                 No distention.  Soft, nontender.  No rebound or guarding.  Musculoskeletal:        General: No swelling. Normal range of motion.  Right knee: Skin: Presence of surgical scar and process of healing, no signs of infection.  Distal right thigh tender to palpation.  No extension of the knee.  Palpable gap above the the kneecap.  Edema on  right thigh that extends to the ankle.  Pulses positive, sensation is intact.  Skin:    General: Skin is warm and dry.     Capillary Refill: Capillary refill takes less than 2 seconds.     Findings: No rash.  Neurological:     Mental Status: The patient is awake and alert. MAE spontaneously. No gross focal neurologic deficits are appreciated.  Psychiatric Mood and affect are normal. Speech and behavior  are normal.       ED Results / Procedures / Treatments   Labs (all labs ordered are listed, but only abnormal results are displayed) Labs Reviewed  COMPREHENSIVE METABOLIC PANEL WITH GFR  CBC WITH DIFFERENTIAL/PLATELET     EKG     RADIOLOGY I independently reviewed and interpreted imaging and agree with radiologists findings.      PROCEDURES:  Critical Care performed:   Procedures   MEDICATIONS ORDERED IN ED: Medications  morphine  (PF) 4 MG/ML injection 4 mg (has no administration in time range)  ketorolac  (TORADOL ) 30 MG/ML injection 60 mg (60 mg Intravenous Given 04/02/24 1656)      IMPRESSION / MDM / ASSESSMENT AND PLAN / ED COURSE  I reviewed the triage vital signs and the nursing notes.  Differential diagnosis includes, but is not limited to, arthroplasty fracture, quadriceps tendon tear, femoral fracture, right knee soft tissue injury.  Patient's presentation is most consistent with acute complicated illness / injury requiring diagnostic workup.   Mathew Porter is a 68 y.o., male who presents today with history of mechanical fall at 11 AM today with posterior pain in right knee, patient can bear weight but is very painful.  Right total knee replacement on March 04 2024 by Dr. Edie.  On physical exam skin presence of a scar in a  healing process from surgery, prepatellar edema, distal thigh tenderness, palpable gap above the patella.  Rest of the physical exam is normal. Consulted Dr. Tobie who saw the patient, did a physical exam, patient will have surgery tomorrow by Dr. Edie, to repair quadriceps tendon tear.  Plan:  Toradol  IV Ice pack CBC CMP  EKG Recheck vital signs Consult hospitalist Admission Surgery tomorrow Dr. Maury personally evaluated the patient ordering Zofran  and US  to rule out DVT.  Patient's diagnosis is consistent with quadriceps tendon tear. I independently reviewed and interpreted imaging and agree with radiologists findings.   Discussed plan of care with patient, answered all of patient's questions, Patient agreeable to plan of care. Patient verbalized understanding.    FINAL CLINICAL IMPRESSION(S) / ED DIAGNOSES   Final diagnoses:  Quadriceps tendon rupture, right, initial encounter     Rx / DC Orders  ED Discharge Orders     None        Note:  This document was prepared using Dragon voice recognition software and may include unintentional dictation errors.   Janit Kast, PA-C 04/02/24 2255    Suzanne Kirsch, MD 04/05/24 [phone]

## 2024-04-02 NOTE — ED Triage Notes (Signed)
 First Nurse Note;  Pt via [facility] from home. Pt c/o of fall today, pt had a R knee replacements about 20 days ago. Pt is A&Ox4 and NAD. EMS reports VSS.

## 2024-04-02 NOTE — ED Triage Notes (Signed)
 Fall, mechanical fall today. Fell backward, bent right leg/knee backward.  TKR 03/04/2024. C/O right knee radiating to hip pain.

## 2024-04-02 NOTE — ED Provider Notes (Addendum)
 Shared visit  Patient had total knee replacement, concern for quadriceps tear.  Unable to fully extend his leg.  Discussed with Dr. Tobie with orthopedics who evaluated the patient in the emergency department and recommended admission for pain control and likely surgical repair tomorrow.  Plan to admit to the hospitalist.  Will get labs for preop.  Given IV pain medication.  On my evaluation has increased swelling to his right leg when compared to the left.  Tachycardic on EKG.  Patient states that it secondary to severe pain.  Denies any chest pain or shortness of breath.  Will add on an ultrasound DVT to further evaluate for DVT to the right lower extremity given his recent surgery.   Suzanne Kirsch, MD 04/02/24 1625    Suzanne Kirsch, MD 04/02/24 [phone]

## 2024-04-03 ENCOUNTER — Inpatient Hospital Stay: Admitting: Certified Registered"

## 2024-04-03 ENCOUNTER — Encounter
Admission: EM | Disposition: A | Discharge status: Skilled Nursing Facility | Payer: Self-pay | Source: Home / Self Care | Attending: Obstetrics and Gynecology

## 2024-04-03 ENCOUNTER — Other Ambulatory Visit: Payer: Self-pay

## 2024-04-03 ENCOUNTER — Encounter: Payer: Self-pay | Admitting: Internal Medicine

## 2024-04-03 DIAGNOSIS — S76111A Strain of right quadriceps muscle, fascia and tendon, initial encounter: Secondary | ICD-10-CM | POA: Diagnosis not present

## 2024-04-03 HISTORY — PX: QUADRICEPS TENDON REPAIR: SHX756

## 2024-04-03 LAB — COMPREHENSIVE METABOLIC PANEL WITH GFR
ALT: 17 U/L (ref 0–44)
AST: 18 U/L (ref 15–41)
Albumin: 3.1 g/dL — ABNORMAL LOW (ref 3.5–5.0)
Alkaline Phosphatase: 68 U/L (ref 38–126)
Anion gap: 8 (ref 5–15)
BUN: 18 mg/dL (ref 8–23)
CO2: 26 mmol/L (ref 22–32)
Calcium: 8.5 mg/dL — ABNORMAL LOW (ref 8.9–10.3)
Chloride: 101 mmol/L (ref 98–111)
Creatinine, Ser: 0.85 mg/dL (ref 0.61–1.24)
GFR, Estimated: >60 mL/min (ref >60)
Glucose, Bld: 140 mg/dL — ABNORMAL HIGH (ref 70–99)
Potassium: 4.1 mmol/L (ref 3.5–5.1)
Sodium: 135 mmol/L (ref 135–145)
Total Bilirubin: 0.8 mg/dL (ref 0.0–1.2)
Total Protein: 6.6 g/dL (ref 6.5–8.1)

## 2024-04-03 LAB — CBC
HCT: 37.5 % — ABNORMAL LOW (ref 39.0–52.0)
Hemoglobin: 12.2 g/dL — ABNORMAL LOW (ref 13.0–17.0)
MCH: 29.6 pg (ref 26.0–34.0)
MCHC: 32.5 g/dL (ref 30.0–36.0)
MCV: 91 fL (ref 80.0–100.0)
Platelets: 316 K/uL (ref 150–400)
RBC: 4.12 MIL/uL — ABNORMAL LOW (ref 4.22–5.81)
RDW: 14.6 % (ref 11.5–15.5)
WBC: 8.5 K/uL (ref 4.0–10.5)
nRBC: 0 % (ref 0.0–0.2)

## 2024-04-03 LAB — GLUCOSE, CAPILLARY
Glucose-Capillary: 136 mg/dL — ABNORMAL HIGH (ref 70–99)
Glucose-Capillary: 119 mg/dL — ABNORMAL HIGH (ref 70–99)
Glucose-Capillary: 121 mg/dL — ABNORMAL HIGH (ref 70–99)
Glucose-Capillary: 162 mg/dL — ABNORMAL HIGH (ref 70–99)

## 2024-04-03 LAB — HIV ANTIBODY (ROUTINE TESTING W REFLEX): HIV Screen 4th Generation wRfx: NONREACTIVE

## 2024-04-03 LAB — LACTIC ACID, PLASMA: Lactic Acid, Venous: 2.1 mmol/L (ref 0.5–1.9)

## 2024-04-03 LAB — C-REACTIVE PROTEIN: CRP: 0.8 mg/dL (ref <1.0)

## 2024-04-03 SURGERY — REPAIR, TENDON, QUADRICEPS
Anesthesia: General | Laterality: Right

## 2024-04-03 MED ORDER — PROPOFOL 10 MG/ML IV BOLUS
INTRAVENOUS | Status: DC | PRN
  Administered 2024-04-03: 50 mg via INTRAVENOUS
  Administered 2024-04-03: 150 mg via INTRAVENOUS

## 2024-04-03 MED ORDER — DOCUSATE SODIUM 100 MG PO CAPS
100.0000 mg | ORAL_CAPSULE | Freq: Two times a day (BID) | ORAL | Status: DC
  Administered 2024-04-03 – 2024-04-08 (×7): 100 mg via ORAL
  Filled 2024-04-03 (×11): qty 1

## 2024-04-03 MED ORDER — ROCURONIUM BROMIDE 10 MG/ML (PF) SYRINGE
PREFILLED_SYRINGE | INTRAVENOUS | Status: AC
  Filled 2024-04-03: qty 10

## 2024-04-03 MED ORDER — FENTANYL CITRATE (PF) 100 MCG/2ML IJ SOLN
25.0000 ug | INTRAMUSCULAR | Status: DC | PRN
  Administered 2024-04-03 (×2): 50 ug via INTRAVENOUS

## 2024-04-03 MED ORDER — FENTANYL CITRATE (PF) 100 MCG/2ML IJ SOLN
INTRAMUSCULAR | Status: AC
  Filled 2024-04-03: qty 2

## 2024-04-03 MED ORDER — ONDANSETRON HCL 4 MG/2ML IJ SOLN
INTRAMUSCULAR | Status: DC | PRN
  Administered 2024-04-03: 4 mg via INTRAVENOUS

## 2024-04-03 MED ORDER — POLYETHYLENE GLYCOL 3350 17 G PO PACK: 17.0000 g | PACK | Freq: Every day | ORAL | Status: DC

## 2024-04-03 MED ORDER — DIPHENHYDRAMINE HCL 12.5 MG/5ML PO ELIX: 12.5000 mg | ORAL_SOLUTION | ORAL | Status: DC | PRN

## 2024-04-03 MED ORDER — FENTANYL CITRATE (PF) 100 MCG/2ML IJ SOLN
INTRAMUSCULAR | Status: AC
Start: 2024-04-03 — End: 2024-04-03
  Filled 2024-04-03: qty 2

## 2024-04-03 MED ORDER — DEXMEDETOMIDINE HCL IN NACL 80 MCG/20ML IV SOLN
INTRAVENOUS | Status: DC | PRN
  Administered 2024-04-03: 8 ug via INTRAVENOUS
  Administered 2024-04-03: 12 ug via INTRAVENOUS

## 2024-04-03 MED ORDER — ORAL CARE MOUTH RINSE: 15.0000 mL | Freq: Once | OROMUCOSAL | Status: AC

## 2024-04-03 MED ORDER — BUPIVACAINE LIPOSOME 1.3 % IJ SUSP
INTRAMUSCULAR | Status: AC
  Filled 2024-04-03: qty 20

## 2024-04-03 MED ORDER — APIXABAN 2.5 MG PO TABS
2.5000 mg | ORAL_TABLET | Freq: Two times a day (BID) | ORAL | Status: DC
  Administered 2024-04-04 – 2024-04-08 (×9): 2.5 mg via ORAL
  Filled 2024-04-03 (×9): qty 1

## 2024-04-03 MED ORDER — CHLORHEXIDINE GLUCONATE 4 % EX SOLN: 1.0000 | CUTANEOUS | 1 refills | Status: AC

## 2024-04-03 MED ORDER — FLEET ENEMA RE ENEM: 1.0000 | ENEMA | Freq: Once | RECTAL | Status: DC | PRN

## 2024-04-03 MED ORDER — OXYCODONE HCL 5 MG PO TABS
5.0000 mg | ORAL_TABLET | Freq: Once | ORAL | Status: AC | PRN
  Administered 2024-04-03: 5 mg via ORAL

## 2024-04-03 MED ORDER — ACETAMINOPHEN 500 MG PO TABS
1000.0000 mg | ORAL_TABLET | Freq: Four times a day (QID) | ORAL | Status: AC
  Administered 2024-04-03 – 2024-04-04 (×3): 1000 mg via ORAL
  Filled 2024-04-03 (×5): qty 2

## 2024-04-03 MED ORDER — KETOROLAC TROMETHAMINE 15 MG/ML IJ SOLN
7.5000 mg | Freq: Four times a day (QID) | INTRAMUSCULAR | Status: AC
  Administered 2024-04-03 – 2024-04-04 (×4): 7.5 mg via INTRAVENOUS
  Filled 2024-04-03 (×4): qty 1

## 2024-04-03 MED ORDER — BISACODYL 10 MG RE SUPP: 10.0000 mg | Freq: Every day | RECTAL | Status: DC | PRN

## 2024-04-03 MED ORDER — ONDANSETRON HCL 4 MG/2ML IJ SOLN
INTRAMUSCULAR | Status: AC
  Filled 2024-04-03: qty 2

## 2024-04-03 MED ORDER — SODIUM CHLORIDE 0.9 % IV SOLN: INTRAVENOUS | Status: DC

## 2024-04-03 MED ORDER — DEXAMETHASONE SODIUM PHOSPHATE 10 MG/ML IJ SOLN
INTRAMUSCULAR | Status: DC | PRN
  Administered 2024-04-03: 10 mg via INTRAVENOUS

## 2024-04-03 MED ORDER — CHLORHEXIDINE GLUCONATE 0.12 % MT SOLN
OROMUCOSAL | Status: AC
  Filled 2024-04-03: qty 15

## 2024-04-03 MED ORDER — SUCCINYLCHOLINE CHLORIDE 200 MG/10ML IV SOSY
PREFILLED_SYRINGE | INTRAVENOUS | Status: DC | PRN
  Administered 2024-04-03: 140 mg via INTRAVENOUS

## 2024-04-03 MED ORDER — MIDAZOLAM HCL 2 MG/2ML IJ SOLN
INTRAMUSCULAR | Status: DC | PRN
  Administered 2024-04-03: 2 mg via INTRAVENOUS

## 2024-04-03 MED ORDER — CHLORHEXIDINE GLUCONATE 0.12 % MT SOLN
15.0000 mL | Freq: Once | OROMUCOSAL | Status: AC
  Administered 2024-04-03: 15 mL via OROMUCOSAL

## 2024-04-03 MED ORDER — SUMATRIPTAN SUCCINATE 50 MG PO TABS
50.0000 mg | ORAL_TABLET | Freq: Every day | ORAL | Status: DC | PRN
  Administered 2024-04-05 – 2024-04-06 (×2): 50 mg via ORAL
  Filled 2024-04-03 (×3): qty 1

## 2024-04-03 MED ORDER — FENTANYL CITRATE (PF) 100 MCG/2ML IJ SOLN
25.0000 ug | INTRAMUSCULAR | Status: DC | PRN
  Administered 2024-04-03 (×4): 50 ug via INTRAVENOUS

## 2024-04-03 MED ORDER — ONDANSETRON HCL 4 MG PO TABS: 4.0000 mg | ORAL_TABLET | Freq: Four times a day (QID) | ORAL | Status: DC | PRN

## 2024-04-03 MED ORDER — ONDANSETRON HCL 4 MG/2ML IJ SOLN: 4.0000 mg | Freq: Four times a day (QID) | INTRAMUSCULAR | Status: DC | PRN

## 2024-04-03 MED ORDER — OXYCODONE HCL 5 MG PO TABS: 5.0000 mg | ORAL_TABLET | Freq: Once | ORAL | Status: DC | PRN

## 2024-04-03 MED ORDER — SUMATRIPTAN SUCCINATE 50 MG PO TABS
50.0000 mg | ORAL_TABLET | Freq: Once | ORAL | Status: AC
  Administered 2024-04-03: 50 mg via ORAL
  Filled 2024-04-03: qty 1

## 2024-04-03 MED ORDER — INSULIN ASPART 100 UNIT/ML IJ SOLN
0.0000 [IU] | Freq: Three times a day (TID) | INTRAMUSCULAR | Status: DC
  Administered 2024-04-04: 3 [IU] via SUBCUTANEOUS
  Administered 2024-04-04: 4 [IU] via SUBCUTANEOUS
  Administered 2024-04-04 – 2024-04-06 (×4): 3 [IU] via SUBCUTANEOUS
  Administered 2024-04-06 – 2024-04-07 (×3): 4 [IU] via SUBCUTANEOUS
  Administered 2024-04-08: 3 [IU] via SUBCUTANEOUS
  Administered 2024-04-08: 7 [IU] via SUBCUTANEOUS
  Filled 2024-04-03 (×13): qty 1

## 2024-04-03 MED ORDER — METOCLOPRAMIDE HCL 5 MG/ML IJ SOLN: 5.0000 mg | Freq: Three times a day (TID) | INTRAMUSCULAR | Status: DC | PRN

## 2024-04-03 MED ORDER — MIDAZOLAM HCL 2 MG/2ML IJ SOLN
INTRAMUSCULAR | Status: AC
  Filled 2024-04-03: qty 2

## 2024-04-03 MED ORDER — HYDROMORPHONE HCL 1 MG/ML IJ SOLN
INTRAMUSCULAR | Status: DC | PRN
  Administered 2024-04-03 (×2): .5 mg via INTRAVENOUS

## 2024-04-03 MED ORDER — DEXAMETHASONE SODIUM PHOSPHATE 10 MG/ML IJ SOLN
INTRAMUSCULAR | Status: AC
  Filled 2024-04-03: qty 1

## 2024-04-03 MED ORDER — SUMATRIPTAN SUCCINATE 50 MG PO TABS: 50.0000 mg | ORAL_TABLET | Freq: Three times a day (TID) | ORAL | Status: DC | PRN

## 2024-04-03 MED ORDER — SUGAMMADEX SODIUM 500 MG/5ML IV SOLN
INTRAVENOUS | Status: DC | PRN
  Administered 2024-04-03: 300 mg via INTRAVENOUS

## 2024-04-03 MED ORDER — HYDROMORPHONE HCL 1 MG/ML IJ SOLN
INTRAMUSCULAR | Status: AC
  Filled 2024-04-03: qty 1

## 2024-04-03 MED ORDER — MAGNESIUM HYDROXIDE 400 MG/5ML PO SUSP: 30.0000 mL | Freq: Every day | ORAL | Status: DC | PRN

## 2024-04-03 MED ORDER — CEFAZOLIN SODIUM-DEXTROSE 3-4 GM/150ML-% IV SOLN
3.0000 g | Freq: Four times a day (QID) | INTRAVENOUS | Status: AC
  Administered 2024-04-03 – 2024-04-04 (×2): 3 g via INTRAVENOUS
  Filled 2024-04-03 (×4): qty 150

## 2024-04-03 MED ORDER — CEFAZOLIN SODIUM-DEXTROSE 3-4 GM/150ML-% IV SOLN
3.0000 g | INTRAVENOUS | Status: DC
  Filled 2024-04-03: qty 150

## 2024-04-03 MED ORDER — SUCCINYLCHOLINE CHLORIDE 200 MG/10ML IV SOSY
PREFILLED_SYRINGE | INTRAVENOUS | Status: AC
  Filled 2024-04-03: qty 10

## 2024-04-03 MED ORDER — OXYCODONE HCL 5 MG/5ML PO SOLN: 5.0000 mg | Freq: Once | ORAL | Status: AC | PRN

## 2024-04-03 MED ORDER — OXYCODONE HCL 5 MG PO TABS
ORAL_TABLET | ORAL | Status: AC
Start: 2024-04-03 — End: 2024-04-03
  Filled 2024-04-03: qty 1

## 2024-04-03 MED ORDER — KETOROLAC TROMETHAMINE 15 MG/ML IJ SOLN: 15.0000 mg | Freq: Once | INTRAMUSCULAR | Status: DC

## 2024-04-03 MED ORDER — MUPIROCIN 2 % EX OINT: 1.0000 | TOPICAL_OINTMENT | Freq: Two times a day (BID) | CUTANEOUS | 0 refills | Status: AC

## 2024-04-03 MED ORDER — LIDOCAINE HCL (PF) 2 % IJ SOLN
INTRAMUSCULAR | Status: AC
  Filled 2024-04-03: qty 5

## 2024-04-03 MED ORDER — BUPIVACAINE-EPINEPHRINE (PF) 0.5% -1:200000 IJ SOLN
INTRAMUSCULAR | Status: AC
  Filled 2024-04-03: qty 10

## 2024-04-03 MED ORDER — LIDOCAINE HCL (CARDIAC) PF 100 MG/5ML IV SOSY
PREFILLED_SYRINGE | INTRAVENOUS | Status: DC | PRN
  Administered 2024-04-03: 100 mg via INTRAVENOUS

## 2024-04-03 MED ORDER — BUPIVACAINE-EPINEPHRINE (PF) 0.5% -1:200000 IJ SOLN
INTRAMUSCULAR | Status: DC | PRN
  Administered 2024-04-03: 50 mL via INTRAMUSCULAR

## 2024-04-03 MED ORDER — ACETAMINOPHEN 325 MG PO TABS
325.0000 mg | ORAL_TABLET | Freq: Four times a day (QID) | ORAL | Status: DC | PRN
  Administered 2024-04-07 – 2024-04-08 (×2): 650 mg via ORAL
  Filled 2024-04-03 (×2): qty 2

## 2024-04-03 MED ORDER — LAMOTRIGINE ER 50 MG PO TB24
100.0000 mg | ORAL_TABLET | Freq: Every day | ORAL | Status: DC
  Administered 2024-04-03 – 2024-04-06 (×4): 100 mg via ORAL
  Filled 2024-04-03 (×5): qty 1
  Filled 2024-04-03: qty 2
  Filled 2024-04-03: qty 1

## 2024-04-03 MED ORDER — METOCLOPRAMIDE HCL 10 MG PO TABS: 5.0000 mg | ORAL_TABLET | Freq: Three times a day (TID) | ORAL | Status: DC | PRN

## 2024-04-03 MED ORDER — OXYCODONE HCL 5 MG/5ML PO SOLN: 5.0000 mg | Freq: Once | ORAL | Status: DC | PRN

## 2024-04-03 MED ORDER — 0.9 % SODIUM CHLORIDE (POUR BTL) OPTIME
TOPICAL | Status: DC | PRN
  Administered 2024-04-03: 1000 mL

## 2024-04-03 MED ORDER — ROCURONIUM BROMIDE 100 MG/10ML IV SOLN
INTRAVENOUS | Status: DC | PRN
  Administered 2024-04-03: 50 mg via INTRAVENOUS

## 2024-04-03 SURGICAL SUPPLY — 59 items
BLADE SURG 10 STRL SS (BLADE) IMPLANT
BLADE SURG SZ10 CARB STEEL (BLADE) ×2 IMPLANT
BNDG COHESIVE 4X5 TAN STRL LF (GAUZE/BANDAGES/DRESSINGS) ×1 IMPLANT
BNDG COHESIVE 6X5 TAN ST LF (GAUZE/BANDAGES/DRESSINGS) IMPLANT
BNDG ESMARCH 6X12 STRL LF (GAUZE/BANDAGES/DRESSINGS) ×1 IMPLANT
CASTING MATERIAL DELTA LITE (CAST SUPPLIES) IMPLANT
CHLORAPREP W/TINT 26 (MISCELLANEOUS) ×1 IMPLANT
CUFF TRNQT CYL 24X4X16.5-23 (TOURNIQUET CUFF) IMPLANT
CUFF TRNQT CYL 34X4.125X (TOURNIQUET CUFF) IMPLANT
DRAPE INCISE IOBAN 66X45 STRL (DRAPES) ×1 IMPLANT
DRAPE SHEET LG 3/4 BI-LAMINATE (DRAPES) ×1 IMPLANT
DRAPE SURG 17X11 SM STRL (DRAPES) ×2 IMPLANT
DRAPE SURG ORHT 6 SPLT 77X108 (DRAPES) ×2 IMPLANT
DRSG OPSITE POSTOP 4X12 (GAUZE/BANDAGES/DRESSINGS) ×1 IMPLANT
DRSG OPSITE POSTOP 4X8 (GAUZE/BANDAGES/DRESSINGS) IMPLANT
ELECTRODE REM PT RTRN 9FT ADLT (ELECTROSURGICAL) ×1 IMPLANT
GAUZE SPONGE 4X4 12PLY STRL (GAUZE/BANDAGES/DRESSINGS) ×1 IMPLANT
GAUZE XEROFORM 1X8 LF (GAUZE/BANDAGES/DRESSINGS) IMPLANT
GLOVE BIO SURGEON STRL SZ8 (GLOVE) ×2 IMPLANT
GLOVE INDICATOR 8.0 STRL GRN (GLOVE) ×1 IMPLANT
GLOVE SURG SYN 6.5 PF PI (GLOVE) IMPLANT
GOWN STRL REUS W/ TWL LRG LVL3 (GOWN DISPOSABLE) ×2 IMPLANT
GOWN STRL REUS W/ TWL XL LVL3 (GOWN DISPOSABLE) ×1 IMPLANT
HANDLE YANKAUER SUCT BULB TIP (MISCELLANEOUS) ×1 IMPLANT
HANDLE YANKAUER SUCT OPEN TIP (MISCELLANEOUS) IMPLANT
IMMBOLIZER KNEE 19 BLUE UNIV (SOFTGOODS) ×1 IMPLANT
KIT TURNOVER KIT A (KITS) ×1 IMPLANT
MANIFOLD NEPTUNE II (INSTRUMENTS) ×1 IMPLANT
NDL HYPO 21X1.5 SAFETY (NEEDLE) IMPLANT
NDL MAYO CATGUT SZ1 (NEEDLE) ×1 IMPLANT
NDL REVERSE CUT 1/2 CRC (NEEDLE) IMPLANT
NEEDLE HYPO 21X1.5 SAFETY (NEEDLE) ×1 IMPLANT
NEEDLE MAYO CATGUT SZ1 (NEEDLE) ×1 IMPLANT
NEEDLE REVERSE CUT 1/2 CRC (NEEDLE) ×1 IMPLANT
NS IRRIG 1000ML POUR BTL (IV SOLUTION) ×1 IMPLANT
PACK EXTREMITY ARMC (MISCELLANEOUS) ×1 IMPLANT
PAD ABD DERMACEA PRESS 5X9 (GAUZE/BANDAGES/DRESSINGS) IMPLANT
PADDING CAST BLEND 6X4 STRL (MISCELLANEOUS) IMPLANT
PENCIL SMOKE EVACUATOR (MISCELLANEOUS) ×1 IMPLANT
RETRIEVER SUT HEWSON (MISCELLANEOUS) ×1 IMPLANT
SCOTCHCAST PLUS 5X4 WHITE (CAST SUPPLIES) IMPLANT
SPLINT CAST 1 STEP 4X30 (MISCELLANEOUS) IMPLANT
SPONGE T-LAP 18X18 ~~LOC~~+RFID (SPONGE) ×1 IMPLANT
STAPLER SKIN PROX 35W (STAPLE) ×1 IMPLANT
STOCKINETTE IMPERVIOUS 9X36 MD (GAUZE/BANDAGES/DRESSINGS) ×1 IMPLANT
STOCKINETTE M/LG 89821 (MISCELLANEOUS) IMPLANT
SUT ETHIBOND #5 BRAIDED 30INL (SUTURE) ×1 IMPLANT
SUT ULTRABRAID #2 38 (SUTURE) IMPLANT
SUT ULTRABRAID 2 COBRAID 38 (SUTURE) IMPLANT
SUT VIC AB 0 CT1 36 (SUTURE) ×2 IMPLANT
SUT VIC AB 2-0 CT1 (SUTURE) IMPLANT
SUT VIC AB 2-0 CT1 TAPERPNT 27 (SUTURE) ×2 IMPLANT
SUT VIC AB 2-0 CT2 27 (SUTURE) ×2 IMPLANT
SUT VIC AB 3-0 SH 27X BRD (SUTURE) ×2 IMPLANT
SUT XBRAID 1.4 BLUE (SUTURE) IMPLANT
SUTURE FIBERWR #2 38 BLUE 1/2 (SUTURE) IMPLANT
SYR 30ML LL (SYRINGE) IMPLANT
TRAP FLUID SMOKE EVACUATOR (MISCELLANEOUS) ×1 IMPLANT
WATER STERILE IRR 500ML POUR (IV SOLUTION) ×1 IMPLANT

## 2024-04-03 NOTE — Anesthesia Procedure Notes (Signed)
 Procedure Name: Intubation Date/Time: 04/03/2024 5:04 PM  Performed by: Jaylene Nest, CRNAPre-anesthesia Checklist: Patient identified, Patient being monitored, Timeout performed, Emergency Drugs available and Suction available Patient Re-evaluated:Patient Re-evaluated prior to induction Oxygen Delivery Method: Circle system utilized Preoxygenation: Pre-oxygenation with 100% oxygen Induction Type: IV induction Ventilation: Mask ventilation without difficulty Laryngoscope Size: McGrath and 4 Grade View: Grade I Tube type: Oral Tube size: 7.5 mm Number of attempts: 1 Airway Equipment and Method: Stylet and Video-laryngoscopy Placement Confirmation: ETT inserted through vocal cords under direct vision, positive ETCO2 and breath sounds checked- equal and bilateral Secured at: 21 cm Tube secured with: Tape Dental Injury: Teeth and Oropharynx as per pre-operative assessment

## 2024-04-03 NOTE — Plan of Care (Signed)

## 2024-04-03 NOTE — Consult Note (Signed)
 ORTHOPAEDIC CONSULTATION  REQUESTING PHYSICIAN: Wouk, Devaughn Sayres, MD  Chief Complaint:   Right knee pain and swelling status post recent right TKA.  History of Present Illness: Mathew Porter is a 68 y.o. male with a past medical history notable for hypertension, diabetes, sleep apnea, and morbid obesity who is now 1 month status post a right total knee arthroplasty made the patient states he was doing quite well following this procedure until yesterday afternoon.  He apparently got up from his desk to take several steps to his walker when he apparently fell to the ground.  He is not sure if his knee buckled before he fell or if he had struck his knee on something as he fell.  Regardless, he was noted significant pain in his knee and was unable to stand up.  EMS was called and he was brought to the emergency room where his history and examination were suspicious for an acute rupture of his right quadriceps tendon.  The patient has been admitted for definitive management of this injury.  The patient denies any associated injuries he did not strike his head or lose consciousness.  The patient also denies any lightheadedness, dizziness, chest pain, shortness of breath, or other symptoms which may have precipitated his fall.  Past Medical History:  Diagnosis Date   Anemia    Anxiety    Asthma    Cellulitis    Diabetes mellitus without complication (HCC)    Dyspnea    Headache    HTN (hypertension)    Obesity    Pneumonia    Primary osteoarthritis of right knee    Pyogenic granuloma of skin    Sleep apnea    Past Surgical History:  Procedure Laterality Date   COLON SURGERY  1994   Diverticulitis   COLONOSCOPY WITH PROPOFOL  N/A 10/01/2019   Procedure: COLONOSCOPY WITH PROPOFOL ;  Surgeon: Dessa Reyes ORN, MD;  Location: [facility] END[facility]COPY;  Service: Endoscopy;  Laterality: N/A;   HYDROCELE EXCISION / REPAIR  2009   TOTAL  KNEE ARTHROPLASTY Right 03/04/2024   Procedure: ARTHROPLASTY, KNEE, TOTAL;  Surgeon: Edie Norleen PARAS, MD;  Location: [facility] ORS;  Service: Orthopedics;  Laterality: Right;   VASECTOMY  2009   Social History   Socioeconomic History   Marital status: Married    Spouse name: CRICENTI,SORAYA H (Spouse)   Number of children: Not on file   Years of education: Not on file   Highest education level: Not on file  Occupational History   Not on file  Tobacco Use   Smoking status: Former    Current packs/day: 0.00    Types: Cigarettes    Start date: 08/21/1974    Quit date: 08/21/2014    Years since quitting: 9.6   Smokeless tobacco: Never  Vaping Use   Vaping status: Never Used  Substance and Sexual Activity   Alcohol use: Not Currently   Drug use: Never   Sexual activity: Yes  Other Topics Concern   Not on file  Social History Narrative   Lives alone   Social Drivers of Health   Financial Resource Strain: Low Risk  (03/19/2024)   Received from [facility]   Overall Financial Resource Strain (CARDIA)    Difficulty of Paying Living Expenses: Not hard at all  Food Insecurity: No Food Insecurity (04/02/2024)   Hunger Vital Sign    Worried About Running Out of Food in the Last Year: Never true    Ran Out of Food in  the Last Year: Never true  Transportation Needs: No Transportation Needs (04/02/2024)   PRAPARE - Administrator, Civil Service (Medical): No    Lack of Transportation (Non-Medical): No  Physical Activity: Not on file  Stress: Not on file  Social Connections: Moderately Isolated (04/02/2024)   Social Connection and Isolation Panel    Frequency of Communication with Friends and Family: Three times a week    Frequency of Social Gatherings with Friends and Family: Never    Attends Religious Services: Never    Database administrator or Organizations: No    Attends Engineer, structural: Never    Marital Status: Married   Family History   Problem Relation Age of Onset   Lung cancer Father    Allergies  Allergen Reactions   Doxycycline  Hives and Itching   Prior to Admission medications   Medication Sig Start Date End Date Taking? Authorizing Provider  aspirin -acetaminophen -caffeine  (EXCEDRIN  MIGRAINE) 250-250-65 MG tablet Take 2 tablets by mouth every 6 (six) hours as needed for headache.   Yes [provider]  chlorhexidine  (HIBICLENS ) 4 % external liquid Apply 15 mLs (1 Application total) topically as directed for 30 doses. Use as directed daily for 5 days every other week for 6 weeks. 03/04/24  Yes Erminie Foulks, Norleen PARAS, MD  cholecalciferol  (VITAMIN D3) 25 MCG (1000 UNIT) tablet Take 1,000 Units by mouth daily.   Yes [provider]  cyanocobalamin  (VITAMIN B12) 1000 MCG tablet Take 1,000 mcg by mouth daily.   Yes [provider]  glipiZIDE  (GLUCOTROL  XL) 2.5 MG 24 hr tablet Take 2.5 mg by mouth daily. 01/15/24  Yes [provider]  lamoTRIgine  (LAMICTAL  XR) 100 MG 24 hour tablet Take 100 mg by mouth at bedtime. 12/05/23  Yes [provider]  metFORMIN  (GLUCOPHAGE -XR) 500 MG 24 hr tablet Take 1,000 mg by mouth daily with supper. 06/11/23  Yes [provider]  MOUNJARO 10 MG/0.5ML Porter Inject 10 mg into the skin every Friday. 02/07/24  Yes [provider]  mupirocin  ointment (BACTROBAN ) 2 % Place 1 Application into the nose 2 (two) times daily for 60 doses. Use as directed 2 times daily for 5 days every other week for 6 weeks. 03/04/24 04/03/24 Yes Zeppelin Beckstrand, Norleen PARAS, MD  ondansetron  (ZOFRAN -ODT) 4 MG disintegrating tablet Take 1 tablet (4 mg total) by mouth every 8 (eight) hours as needed for nausea or vomiting. 03/04/24  Yes Jacari Iannello, Norleen PARAS, MD  simvastatin  (ZOCOR ) 20 MG tablet Take 20 mg by mouth at bedtime. 10/15/17  Yes [provider]  SUMAtriptan  (IMITREX ) 50 MG tablet Take 1 tablet (50 mg total) by mouth 3 (three) times daily as needed for migraine or headache. May repeat  in 2 hours if headache persists or recurs. 11/08/18  Yes Vachhani, Vaibhavkumar, MD  verapamil  (CALAN -SR) 240 MG CR tablet Take 240 mg by mouth every evening. 10/15/17  Yes [provider]  apixaban  (ELIQUIS ) 2.5 MG TABS tablet Take 1 tablet (2.5 mg total) by mouth 2 (two) times daily. Patient not taking: Reported on 04/02/2024 03/05/24   Larnie Heart, Norleen PARAS, MD  chlorhexidine  (HIBICLENS ) 4 % external liquid Apply 15 mLs (1 Application total) topically as directed for 30 doses. Use as directed daily for 5 days every other week for 6 weeks. 03/04/24   Gill Delrossi, Norleen PARAS, MD  mupirocin  ointment (BACTROBAN ) 2 % Place 1 Application into the nose 2 (two) times daily for 60 doses. Use as directed 2 times daily for  5 days every other week for 6 weeks. 03/04/24 04/03/24  Scottlyn Mchaney, Norleen PARAS, MD  oxyCODONE  (ROXICODONE ) 5 MG immediate release tablet Take 1-2 tablets (5-10 mg total) by mouth every 4 (four) hours as needed for moderate pain (pain score 4-6) or severe pain (pain score 7-10). Patient not taking: Reported on 04/02/2024 03/04/24   Shalayne Leach, Norleen PARAS, MD   DG Chest Port 1 View Result Date: 04/02/2024 CLINICAL DATA:  Leukocytosis EXAM: PORTABLE CHEST 1 VIEW COMPARISON:  11/07/2018 FINDINGS: Cardiac shadow is within normal limits. The lungs are well aerated bilaterally. No focal infiltrate or sizable effusion is noted. The bony structures are within normal limits. IMPRESSION: No active disease. Electronically Signed   By: Oneil Devonshire M.D.   On: 04/02/2024 22:21   US  Venous Img Lower Unilateral Right Result Date: 04/02/2024 CLINICAL DATA:  Right leg swelling EXAM: RIGHT LOWER EXTREMITY VENOUS DOPPLER ULTRASOUND TECHNIQUE: Gray-scale sonography with graded compression, as well as color Doppler and duplex ultrasound were performed to evaluate the lower extremity deep venous systems from the level of the common femoral vein and including the common femoral, femoral, profunda femoral, popliteal and calf veins including the  posterior tibial, peroneal and gastrocnemius veins when visible. The superficial great saphenous vein was also interrogated. Spectral Doppler was utilized to evaluate flow at rest and with distal augmentation maneuvers in the common femoral, femoral and popliteal veins. COMPARISON:  None Available. FINDINGS: Contralateral Common Femoral Vein: Respiratory phasicity is normal and symmetric with the symptomatic side. No evidence of thrombus. Normal compressibility. Common Femoral Vein: No evidence of thrombus. Normal compressibility, respiratory phasicity and response to augmentation. Saphenofemoral Junction: No evidence of thrombus. Normal compressibility and flow on color Doppler imaging. Profunda Femoral Vein: No evidence of thrombus. Normal compressibility and flow on color Doppler imaging. Femoral Vein: No evidence of thrombus. Normal compressibility, respiratory phasicity and response to augmentation. Popliteal Vein: No evidence of thrombus. Normal compressibility, respiratory phasicity and response to augmentation. Calf Veins: No evidence of thrombus. Normal compressibility and flow on color Doppler imaging. Superficial Great Saphenous Vein: No evidence of thrombus. Normal compressibility. Venous Reflux:  None. Other Findings:  None. IMPRESSION: No evidence of deep venous thrombosis within the RIGHT lower extremity. Electronically Signed   By: Suzen Dials M.D.   On: 04/02/2024 19:59   DG Femur Min 2 Views Right Result Date: 04/02/2024 CLINICAL DATA:  Pain after fall. Right femur pain. Fell at home. Knee replacement 03/04/24 EXAM: RIGHT FEMUR 2 VIEWS COMPARISON:  None Available. FINDINGS: No acute fracture. Knee arthroplasty is intact. No periprosthetic lucency. Hip alignment is maintained. There is soft tissue edema about the distal thigh and knee. IMPRESSION: 1. No acute fracture of the right femur. 2. Intact knee arthroplasty. 3. Soft tissue edema about the distal thigh and knee. Electronically Signed    By: Andrea Gasman M.D.   On: 04/02/2024 14:08   Positive ROS: All other systems have been reviewed and were otherwise negative with the exception of those mentioned in the HPI and as above.  Physical Exam: General:  Alert, no acute distress Psychiatric:  Patient is competent for consent with normal mood and affect   Cardiovascular:  No pedal edema Respiratory:  No wheezing, non-labored breathing GI:  Abdomen is soft and non-tender Skin:  No lesions in the area of chief complaint Neurologic:  Sensation intact distally Lymphatic:  No axillary or cervical lymphadenopathy  Orthopedic Exam:  Orthopedic examination is limited to the right knee and lower extremity.  Skin inspection  is notable for a well-healed anterior midline incision, as well as for significant swelling and ecchymosis over the anterior aspect of the knee extending into the distal thigh and proximal tibial regions.  No erythema, abrasions, or other skin abnormalities are identified.  He experiences moderate tenderness palpation over the quadriceps tendon just proximal to the superior pole of the patella.  There also appears to be a palpable defect in the tendon in this region.  He is unable to actively extend his knee.  He is grossly neurovascularly intact to the right lower extremity and foot.  X-rays:  Recent x-rays of the right femur including the knee are available for review and have been reviewed by myself.  The films demonstrate excellent position of the femoral, tibial, and patellar components which are without evidence of loosening.  There is a large effusion, but no bony abnormalities are identified.  Assessment: Acute rupture right quadriceps tendon status post recent TKA.  Plan: The treatment options, including both surgical and nonsurgical choices, have been discussed in detail with the patient.  The patient would like to proceed with surgical intervention to include a primary repair of the right quadriceps tendon  rupture.  The risks (including bleeding, infection, nerve and/or blood vessel injury, persistent or recurrent pain, failure of the repair, recurrence of tear, stiffness of the knee, need for further surgery, blood clots, strokes, heart attacks or arrhythmias, pneumonia, etc.) and benefits of the surgical procedure were discussed.  The patient states his understanding and agrees to proceed.  A formal written consent will be obtained by the nursing staff.  Thank you for asking me to participate in the care of this most pleasant and unfortunate man.  I will be happy to follow him with you.   Mathew Reyes Maltos, MD  Beeper #:  [phone]  04/03/2024 4:17 PM

## 2024-04-03 NOTE — Transfer of Care (Signed)
 Immediate Anesthesia Transfer of Care Note  Patient: Mathew Porter  Procedure(s) Performed: REPAIR, TENDON, QUADRICEPS (Right)  Patient Location: PACU  Anesthesia Type:General  Level of Consciousness: awake  Airway & Oxygen Therapy: Patient Spontanous Breathing and Patient connected to face mask oxygen  Post-op Assessment: Report given to RN and Post -op Vital signs reviewed and stable  Post vital signs: Reviewed and stable  Last Vitals:  Vitals Value Taken Time  BP 166/94 04/03/24 18:54  Temp    Pulse 97 04/03/24 18:57  Resp 17 04/03/24 18:57  SpO2 97 % 04/03/24 18:57  Vitals shown include unfiled device data.  Last Pain:  Vitals:   04/03/24 1425  TempSrc: Oral  PainSc: 2       Patients Stated Pain Goal: 2 (04/02/24 1300)  Complications: No notable events documented.

## 2024-04-03 NOTE — Anesthesia Preprocedure Evaluation (Signed)
 Anesthesia Evaluation  Patient identified by MRN, date of birth, ID band Patient awake    Reviewed: Allergy & Precautions, NPO status , Patient's Chart, lab work & pertinent test results  Airway Mallampati: III  TM Distance: >3 FB Neck ROM: full    Dental  (+) Chipped   Pulmonary asthma , sleep apnea , former smoker   Pulmonary exam normal        Cardiovascular Exercise Tolerance: Good hypertension, (-) angina (-) Past MI and (-) DOE Normal cardiovascular exam     Neuro/Psych  Headaches  negative psych ROS   GI/Hepatic negative GI ROS, Neg liver ROS,,,  Endo/Other  diabetes, Type 2  Class 3 obesity  Renal/GU      Musculoskeletal   Abdominal   Peds  Hematology negative hematology ROS (+)   Anesthesia Other Findings Past Medical History: No date: Anemia No date: Anxiety No date: Asthma No date: Cellulitis No date: Diabetes mellitus without complication (HCC) No date: Dyspnea No date: Headache No date: HTN (hypertension) No date: Obesity No date: Pneumonia No date: Primary osteoarthritis of right knee No date: Pyogenic granuloma of skin No date: Sleep apnea  Past Surgical History: 1994: COLON SURGERY     Comment:  Diverticulitis 10/01/2019: COLONOSCOPY WITH PROPOFOL ; N/A     Comment:  Procedure: COLONOSCOPY WITH PROPOFOL ;  Surgeon: Dessa Reyes ORN, MD;  Location: [facility] ENDOSCOPY;  Service:               Endoscopy;  Laterality: N/A; 2009: HYDROCELE EXCISION / REPAIR 03/04/2024: TOTAL KNEE ARTHROPLASTY; Right     Comment:  Procedure: ARTHROPLASTY, KNEE, TOTAL;  Surgeon: Edie Norleen PARAS, MD;  Location: [facility] ORS;  Service: Orthopedics;                Laterality: Right; 2009: VASECTOMY  BMI    Body Mass Index: 46.03 kg/m      Reproductive/Obstetrics negative OB ROS                              Anesthesia Physical Anesthesia Plan  ASA:  3  Anesthesia Plan: General ETT   Post-op Pain Management:    Induction: Intravenous  PONV Risk Score and Plan: Ondansetron , Dexamethasone , Midazolam  and Treatment may vary due to age or medical condition  Airway Management Planned: Oral ETT  Additional Equipment:   Intra-op Plan:   Post-operative Plan: Extubation in OR  Informed Consent: I have reviewed the patients History and Physical, chart, labs and discussed the procedure including the risks, benefits and alternatives for the proposed anesthesia with the patient or authorized representative who has indicated his/her understanding and acceptance.     Dental Advisory Given  Plan Discussed with: Anesthesiologist, CRNA and Surgeon  Anesthesia Plan Comments: (Patient consented for risks of anesthesia including but not limited to:  - adverse reactions to medications - damage to eyes, teeth, lips or other oral mucosa - nerve damage due to positioning  - sore throat or hoarseness - Damage to heart, brain, nerves, lungs, other parts of body or loss of life  Patient voiced understanding and assent.)        Anesthesia Quick Evaluation

## 2024-04-03 NOTE — Op Note (Signed)
 04/02/2024 - 04/03/2024  6:57 PM  Patient:   Mathew Porter  Pre-Op Diagnosis:   Acute quadriceps tendon rupture status post total knee replacement, right knee.  Post-Op Diagnosis:   Same with dehiscence of medial retinacular repair, right knee.  Procedure:   Primary repair of quadriceps tendon rupture and medial retinacular dehiscence, right knee.  Surgeon:   DOROTHA Reyes Maltos, MD  Assistant:   Annabella Gambler, RN  Anesthesia:   GET  Findings:   As above.  Complications:   None  Fluids:   900 cc crystalloid  EBL:   50 cc  TT:   66 minutes at 300 mmHg  Drains:   None  Closure:   Staples  Implants:   None  Brief Clinical Note:   The patient is a 68 year old male who is now 1 month status post a right total knee arthroplasty. The patient was doing well until yesterday afternoon. Apparently, he got up from his desk in his office and took several steps toward his walker when he fell and developed significant pain in his knee. He was brought to the emergency room where he was evaluated by the on-call orthopedic provider who was concerned that he had an acute rupture of his quadriceps tendon. The patient was admitted and presents at this time for definitive management of this injury.  Procedure:   The patient was brought into the operating room and lain in the supine position. After adequate general endotracheal intubation and anesthesia were obtained, the patient's right lower extremity was prepped with ChloraPrep solution before being draped sterilely. Preoperative antibiotics were administered. A timeout was performed to verify the appropriate surgical site before the limb was exsanguinated with an Esmarch and the thigh tourniquet inflated to 300 mmHg.   Utilizing the previous anterior midline incision, an approximately 6 to 7 inch incision was made over the anterior aspect of the knee centered at the superior pole of the patella. The incision was carried down through the  subcutaneous tissues to expose the superficial retinaculum. This was split the length of the incision to reveal the ruptured quadriceps tendon. The abundant hematoma was debrided/irrigated out of the knee. Careful inspection of the tendon demonstrated that it appeared to have torn/avulsed at the muscle tendon junction proximally, but the quadriceps tendon and medial retinaculum tore/dehisced along the prior surgical repair. The torn margins of the tendon tear were debrided lightly back to good tissues.  Proximally, the muscle tendon avulsion site was repaired using multiple 1.4 mm fiber tapes placed in an interrupted fashion. Additional 1.4 mm fiber tapes were used to reapproximate the longitudinal tear in the quadriceps tendon and medial retinaculum. The repair was reinforced using additional #2 FiberWire interrupted sutures. The repair was gently tested and found to be stable as the repair could support the weight of the foot at 60 degrees of knee flexion.  The wound was copiously irrigated with sterile saline solution using bulb irrigation before the superficial retinacular layer was re-approximated using #0 Vicryl interrupted sutures. The subcutaneous tissues were closed in two layers using 2-0 Vicryl interrupted sutures before the skin was closed using staples. A sterile bulky dressing was applied to the knee before the leg was placed into a long-leg cast to help protect the repair, given the quality of the tissue and the size of the patient. The patient was then awakened, extubated, and returned to the recovery room in satisfactory condition after tolerating the procedure well.

## 2024-04-03 NOTE — Progress Notes (Signed)
 Pt family at bedside and stated that pt has not worn CPAP in years nor does he wear 02. RN made aware. RT will follow as needed.

## 2024-04-03 NOTE — Progress Notes (Signed)
 PROGRESS NOTE    Mathew Porter  FMW:[id number] DOB: March 22, 1956 DOA: 04/02/2024 PCP: Valora Agent, MD  Outpatient Specialists: orthopedics    Brief Narrative:   From admission h and p Mathew Porter is a 68 y.o. male with medical history significant of Anemia, Anxiety, Asthma, DmII, HA , HTN,obesity, OSA, in addition to interim hx of R TKA Dr. Edie  on 03/04/2024.  Patient presents to ED s/p mechanical fall with injury to right Knee. Patient notes no other injury. Denies head strike. Patient on ros noted no n/v/d/sob/chest pain / abdominal pain / fever/ or chills. Notes he was at his baseline prior to fall. Patient states he believes he slipped while walking. He notes is pain is a 2 at rest but increase to a max of 10 with  movement.     Assessment & Plan:   Principal Problem:   Quadriceps tendon rupture Active Problems:   Hypertension   Morbid obesity with BMI of 40.0-44.9, adult (HCC)   Diabetes (HCC)  # Right quadriceps tendon rupture After trip and fall - operative repair planned for today - pain control, pt/ot eval tomorrow  # s/p right total knee replacement Last month - outpt f/u  # Seizure disorder - home lamictal   # T2DM Glucose appropriate - SSI  # HTN Bp controlled - home verapamil , statin  # Obesity Noted  # OSA - cpap qhs   DVT prophylaxis: SCDs for now Code Status: full Family Communication: wife at bedside 8/14  Level of care: Med-Surg Status is: Inpatient Remains inpatient appropriate because: need for inpatient procedure    Consultants:  orthopedics  Procedures: pending     Subjective: Reports stable right leg pain  Objective: Vitals:   04/02/24 2008 04/02/24 2118 04/03/24 0438 04/03/24 0745  BP: (!) 148/95 133/83 125/77 (!) 141/75  Pulse:  (!) 115 97 99  Resp:  18 14 16   Temp:  98 F (36.7 C) 98 F (36.7 C) 97.6 F (36.4 C)  TempSrc:  Oral  Oral  SpO2:  99% 100% 98%  Weight:        Intake/Output Summary  (Last 24 hours) at 04/03/2024 1329 Last data filed at 04/03/2024 0757 Gross per 24 hour  Intake 440.78 ml  Output 400 ml  Net 40.78 ml   Filed Weights   04/02/24 1300  Weight: (!) 152 kg    Examination:  General exam: Appears calm and comfortable  Respiratory system: Clear to auscultation. Respiratory effort normal. Cardiovascular system: S1 & S2 heard, RRR.   Gastrointestinal system: Abdomen is obese, soft and nontender.   Central nervous system: Alert and oriented. No focal neurological deficits. Extremities: right knee swelling, healed incision. Right thigh swelling Skin: No rashes, lesions or ulcers Psychiatry: Judgement and insight appear normal. Mood & affect appropriate.     Data Reviewed: I have personally reviewed following labs and imaging studies  CBC: Recent Labs  Lab 04/02/24 1645 04/03/24 0551  WBC 13.7* 8.5  NEUTROABS 10.9*  --   HGB 14.3 12.2*  HCT 43.8 37.5*  MCV 90.9 91.0  PLT 396 316   Basic Metabolic Panel: Recent Labs  Lab 04/02/24 1645 04/03/24 0551  NA 134* 135  K 4.3 4.1  CL 101 101  CO2 21* 26  GLUCOSE 164* 140*  BUN 15 18  CREATININE 0.85 0.85  CALCIUM  9.1 8.5*   GFR: Estimated Creatinine Clearance: 125.6 mL/min (by C-G formula based on SCr of 0.85 mg/dL). Liver Function Tests: Recent Labs  Lab  04/02/24 1645 04/03/24 0551  AST 22 18  ALT 21 17  ALKPHOS 82 68  BILITOT 0.8 0.8  PROT 7.6 6.6  ALBUMIN 3.8 3.1*   No results for input(s): LIPASE, AMYLASE in the last 168 hours. No results for input(s): AMMONIA in the last 168 hours. Coagulation Profile: No results for input(s): INR, PROTIME in the last 168 hours. Cardiac Enzymes: No results for input(s): CKTOTAL, CKMB, CKMBINDEX, TROPONINI in the last 168 hours. BNP (last 3 results) No results for input(s): PROBNP in the last 8760 hours. HbA1C: No results for input(s): HGBA1C in the last 72 hours. CBG: Recent Labs  Lab 04/02/24 2210 04/03/24 0743  04/03/24 1152  GLUCAP 141* 136* 119*   Lipid Profile: No results for input(s): CHOL, HDL, LDLCALC, TRIG, CHOLHDL, LDLDIRECT in the last 72 hours. Thyroid Function Tests: No results for input(s): TSH, T4TOTAL, FREET4, T3FREE, THYROIDAB in the last 72 hours. Anemia Panel: No results for input(s): VITAMINB12, FOLATE, FERRITIN, TIBC, IRON, RETICCTPCT in the last 72 hours. Urine analysis:    Component Value Date/Time   COLORURINE YELLOW (A) 02/21/2024 1356   APPEARANCEUR CLEAR (A) 02/21/2024 1356   APPEARANCEUR Clear 05/23/2018 1043   LABSPEC 1.020 02/21/2024 1356   PHURINE 5.0 02/21/2024 1356   GLUCOSEU NEGATIVE 02/21/2024 1356   HGBUR NEGATIVE 02/21/2024 1356   BILIRUBINUR NEGATIVE 02/21/2024 1356   BILIRUBINUR Negative 05/23/2018 1043   KETONESUR NEGATIVE 02/21/2024 1356   PROTEINUR NEGATIVE 02/21/2024 1356   NITRITE NEGATIVE 02/21/2024 1356   LEUKOCYTESUR NEGATIVE 02/21/2024 1356   Sepsis Labs: @LABRCNTIP (procalcitonin:4,lacticidven:4)  )No results found for this or any previous visit (from the past 240 hours).       Radiology Studies: DG Chest Port 1 View Result Date: 04/02/2024 CLINICAL DATA:  Leukocytosis EXAM: PORTABLE CHEST 1 VIEW COMPARISON:  11/07/2018 FINDINGS: Cardiac shadow is within normal limits. The lungs are well aerated bilaterally. No focal infiltrate or sizable effusion is noted. The bony structures are within normal limits. IMPRESSION: No active disease. Electronically Signed   By: Oneil Devonshire M.D.   On: 04/02/2024 22:21   US  Venous Img Lower Unilateral Right Result Date: 04/02/2024 CLINICAL DATA:  Right leg swelling EXAM: RIGHT LOWER EXTREMITY VENOUS DOPPLER ULTRASOUND TECHNIQUE: Gray-scale sonography with graded compression, as well as color Doppler and duplex ultrasound were performed to evaluate the lower extremity deep venous systems from the level of the common femoral vein and including the common femoral, femoral,  profunda femoral, popliteal and calf veins including the posterior tibial, peroneal and gastrocnemius veins when visible. The superficial great saphenous vein was also interrogated. Spectral Doppler was utilized to evaluate flow at rest and with distal augmentation maneuvers in the common femoral, femoral and popliteal veins. COMPARISON:  None Available. FINDINGS: Contralateral Common Femoral Vein: Respiratory phasicity is normal and symmetric with the symptomatic side. No evidence of thrombus. Normal compressibility. Common Femoral Vein: No evidence of thrombus. Normal compressibility, respiratory phasicity and response to augmentation. Saphenofemoral Junction: No evidence of thrombus. Normal compressibility and flow on color Doppler imaging. Profunda Femoral Vein: No evidence of thrombus. Normal compressibility and flow on color Doppler imaging. Femoral Vein: No evidence of thrombus. Normal compressibility, respiratory phasicity and response to augmentation. Popliteal Vein: No evidence of thrombus. Normal compressibility, respiratory phasicity and response to augmentation. Calf Veins: No evidence of thrombus. Normal compressibility and flow on color Doppler imaging. Superficial Great Saphenous Vein: No evidence of thrombus. Normal compressibility. Venous Reflux:  None. Other Findings:  None. IMPRESSION: No evidence of deep venous  thrombosis within the RIGHT lower extremity. Electronically Signed   By: Suzen Dials M.D.   On: 04/02/2024 19:59   DG Femur Min 2 Views Right Result Date: 04/02/2024 CLINICAL DATA:  Pain after fall. Right femur pain. Fell at home. Knee replacement 03/04/24 EXAM: RIGHT FEMUR 2 VIEWS COMPARISON:  None Available. FINDINGS: No acute fracture. Knee arthroplasty is intact. No periprosthetic lucency. Hip alignment is maintained. There is soft tissue edema about the distal thigh and knee. IMPRESSION: 1. No acute fracture of the right femur. 2. Intact knee arthroplasty. 3. Soft tissue  edema about the distal thigh and knee. Electronically Signed   By: Andrea Gasman M.D.   On: 04/02/2024 14:08        Scheduled Meds:   ceFAZolin  (ANCEF ) IV  3 g Intravenous 30 min Pre-Op   insulin  aspart  0-6 Units Subcutaneous TID WC    morphine  injection  4 mg Intravenous Once   simvastatin   20 mg Oral QHS   verapamil   240 mg Oral QPM   Continuous Infusions:   LOS: 1 day     Devaughn KATHEE Ban, MD [facility]   If 7PM-7AM, please contact night-coverage [email or web address] Password [facility] 04/03/2024, 1:29 PM

## 2024-04-04 ENCOUNTER — Other Ambulatory Visit (HOSPITAL_COMMUNITY): Payer: Self-pay

## 2024-04-04 ENCOUNTER — Telehealth (HOSPITAL_COMMUNITY): Payer: Self-pay | Admitting: Pharmacy Technician

## 2024-04-04 DIAGNOSIS — S76111A Strain of right quadriceps muscle, fascia and tendon, initial encounter: Secondary | ICD-10-CM | POA: Diagnosis not present

## 2024-04-04 LAB — GLUCOSE, CAPILLARY
Glucose-Capillary: 149 mg/dL — ABNORMAL HIGH (ref 70–99)
Glucose-Capillary: 158 mg/dL — ABNORMAL HIGH (ref 70–99)
Glucose-Capillary: 135 mg/dL — ABNORMAL HIGH (ref 70–99)
Glucose-Capillary: 145 mg/dL — ABNORMAL HIGH (ref 70–99)

## 2024-04-04 LAB — BASIC METABOLIC PANEL WITH GFR
Anion gap: 8 (ref 5–15)
BUN: 12 mg/dL (ref 8–23)
CO2: 25 mmol/L (ref 22–32)
Calcium: 8.4 mg/dL — ABNORMAL LOW (ref 8.9–10.3)
Chloride: 103 mmol/L (ref 98–111)
Creatinine, Ser: 0.71 mg/dL (ref 0.61–1.24)
GFR, Estimated: >60 mL/min (ref >60)
Glucose, Bld: 159 mg/dL — ABNORMAL HIGH (ref 70–99)
Potassium: 4.6 mmol/L (ref 3.5–5.1)
Sodium: 136 mmol/L (ref 135–145)

## 2024-04-04 LAB — HEMOGLOBIN A1C
Hgb A1c MFr Bld: 6.6 % — ABNORMAL HIGH (ref 4.8–5.6)
Mean Plasma Glucose: 143 mg/dL

## 2024-04-04 LAB — CBC
HCT: 38.7 % — ABNORMAL LOW (ref 39.0–52.0)
Hemoglobin: 12.6 g/dL — ABNORMAL LOW (ref 13.0–17.0)
MCH: 29.8 pg (ref 26.0–34.0)
MCHC: 32.6 g/dL (ref 30.0–36.0)
MCV: 91.5 fL (ref 80.0–100.0)
Platelets: 316 K/uL (ref 150–400)
RBC: 4.23 MIL/uL (ref 4.22–5.81)
RDW: 14.4 % (ref 11.5–15.5)
WBC: 8.3 K/uL (ref 4.0–10.5)
nRBC: 0 % (ref 0.0–0.2)

## 2024-04-04 MED ORDER — OXYCODONE HCL 5 MG PO TABS: 5.0000 mg | ORAL_TABLET | ORAL | 0 refills | Status: AC | PRN

## 2024-04-04 MED ORDER — POLYETHYLENE GLYCOL 3350 17 G PO PACK
34.0000 g | PACK | Freq: Every day | ORAL | Status: DC
  Administered 2024-04-04 – 2024-04-07 (×3): 34 g via ORAL
  Filled 2024-04-04 (×5): qty 2

## 2024-04-04 MED ORDER — APIXABAN 2.5 MG PO TABS: 2.5000 mg | ORAL_TABLET | Freq: Two times a day (BID) | ORAL | 0 refills | Status: AC

## 2024-04-04 MED ORDER — PROPOFOL 1000 MG/100ML IV EMUL
INTRAVENOUS | Status: AC
  Filled 2024-04-04: qty 100

## 2024-04-04 MED ORDER — PNEUMOCOCCAL 20-VAL CONJ VACC 0.5 ML IM SUSY
0.5000 mL | PREFILLED_SYRINGE | INTRAMUSCULAR | Status: AC
  Administered 2024-04-05: 0.5 mL via INTRAMUSCULAR
  Filled 2024-04-04: qty 0.5

## 2024-04-04 NOTE — Evaluation (Signed)
 Physical Therapy Evaluation Patient Details Name: Mathew Porter MRN: [id number] DOB: 04/09/1956 Today's Date: 04/04/2024  History of Present Illness  Mathew Porter comes to [facility] ED on 04/02/24 after a fall. Pt is s/p Rt TKA 03/04/24. PMH: DM2, seizure-like activity, BPH, HTN, OSA. Workup revealing of quads tendon rupture. Surgical repair was done by Dr. Edie on 8/14, pt places in a long leg cast to protect tissue repair.  Clinical Impression  Pt requires min-modA for supine to/from sitting EOB, does better with elevated EOB given the mechanical limitations of a casted LE. Pt able to achieve SLS from very elevated EOB, essentially a LLE single leg transfer given the mechanical limitations imposed by casted LE. Pt tolerates standing for >2 minutes very well, no LOB, no pain increase. Asked ortho for a cast shoe which will arrive later in AM for OT assessment. Asked for bari recliner and BSC for later. Patient will benefit from skilled physical therapy intervention to reduce deficits and impairments identified in evaluation, in order to reduce pain, improve quality of life, and maximize activity tolerance for ADL, IADL, and leisure/fitness. Physical therapy will help pt achieve long and short term goals of care.        If plan is discharge home, recommend the following: A lot of help with walking and/or transfers;Assistance with cooking/housework;Help with stairs or ramp for entrance;Assist for transportation   Can travel by private vehicle   No    Equipment Recommendations None recommended by PT  Recommendations for Other Services       Functional Status Assessment Patient has had a recent decline in their functional status and demonstrates the ability to make significant improvements in function in a reasonable and predictable amount of time.     Precautions / Restrictions Precautions Precautions: Fall Restrictions RLE Weight Bearing Per Provider Order: Weight bearing as tolerated       Mobility  Bed Mobility Overal bed mobility: Needs Assistance Bed Mobility: Supine to Sit, Sit to Supine     Supine to sit: Mod assist Sit to supine: Mod assist        Transfers Overall transfer level: Needs assistance Equipment used: None (BRW) Transfers: Sit to/from Stand Sit to Stand: From elevated surface, Mod assist           General transfer comment: does well in stnading for 2 mninutes, able to side step, balance well. Additional steps not taken due to being in cast only Rt foot. Have asked for a cast shoe from orthopedics    Ambulation/Gait Ambulation/Gait assistance:  (defer until right equipent availanle.)                Stairs            Wheelchair Mobility     Tilt Bed    Modified Rankin (Stroke Patients Only)       Balance                                             Pertinent Vitals/Pain Pain Assessment Pain Assessment: No/denies pain    Home Living                          Prior Function                       Extremity/Trunk Assessment  Communication        Cognition                                         Cueing       General Comments      Exercises     Assessment/Plan    PT Assessment Patient needs continued PT services  PT Problem List Decreased strength;Decreased range of motion;Decreased activity tolerance;Decreased balance;Decreased mobility;Decreased knowledge of use of DME;Decreased safety awareness;Decreased knowledge of precautions;Cardiopulmonary status limiting activity       PT Treatment Interventions DME instruction;Gait training;Stair training;Functional mobility training;Therapeutic activities;Therapeutic exercise;Balance training;Neuromuscular re-education;Patient/family education    PT Goals (Current goals can be found in the Care Plan section)  Acute Rehab PT Goals Patient Stated Goal: regain mod independent AMB c  RW PT Goal Formulation: With patient Time For Goal Achievement: 04/18/24 Potential to Achieve Goals: Good    Frequency Min 2X/week     Co-evaluation               AM-PAC PT 6 Clicks Mobility  Outcome Measure Help needed turning from your back to your side while in a flat bed without using bedrails?: A Lot Help needed moving from lying on your back to sitting on the side of a flat bed without using bedrails?: A Lot Help needed moving to and from a bed to a chair (including a wheelchair)?: A Lot Help needed standing up from a chair using your arms (e.g., wheelchair or bedside chair)?: A Lot Help needed to walk in hospital room?: Total Help needed climbing 3-5 steps with a railing? : Total 6 Click Score: 10    End of Session   Activity Tolerance: Patient tolerated treatment well;No increased pain;Patient limited by fatigue Patient left: in bed;with call bell/phone within reach;with family/visitor present Nurse Communication: Mobility status PT Visit Diagnosis: Difficulty in walking, not elsewhere classified (R26.2);Other abnormalities of gait and mobility (R26.89);Repeated falls (R29.6);Muscle weakness (generalized) (M62.81)    Time: 9154-9081 PT Time Calculation (min) (ACUTE ONLY): 33 min   Charges:   PT Evaluation $PT Eval Moderate Complexity: 1 Mod PT Treatments $Therapeutic Activity: 8-22 mins PT General Charges $$ ACUTE PT VISIT: 1 Visit    12:53 PM, 04/04/24 Peggye JAYSON Linear, PT, DPT Physical Therapist - [facility]  [phone] (ASCOM)    Luticia Tadros C 04/04/2024, 12:49 PM

## 2024-04-04 NOTE — Evaluation (Signed)
 Occupational Therapy Evaluation Patient Details Name: Mathew Porter MRN: [id number] DOB: Jan 13, 1956 Today's Date: 04/04/2024   History of Present Illness   Mathew Porter comes to [facility] ED on 04/02/24 after a fall. Pt is s/p Rt TKA 03/04/24. PMH: DM2, seizure-like activity, BPH, HTN, OSA. Workup revealing of quads tendon rupture. Surgical repair was done by Dr. Edie on 8/14, pt places in a long leg cast to protect tissue repair.     Clinical Impressions Pt was seen for OT evaluation this date. Prior to hospital admission, pt was recovering at home from TKA 03/04/24, amb with use of RW and receiving assistance from his wife with IADLs as needed. Pt lives with his wife in a one level home with no steps to enter. Pt is currently very limited in his ability to safely and affectively transfer, complete ADLs/IADLs and ambulate due to the mechanical limitations of the casted LE. Pt requires MAXA for donning cast shoe on the RLE and a regular sock on the LLE, due to the restriction of the long leg cast limiting pt's participation in LB dressing. Pt tolerated STS from the EOB with very elevated bed height and use of the BRW, pt step pivoted into the close recliner with MINA for trunk control paired with heavy verbal/tactile cues for technique and sequencing. Pt would benefit from skilled OT services to address noted impairments and functional limitations (see below for any additional details) in order to maximize safety and independence while minimizing falls risk and caregiver burden. OT will follow acutely.      If plan is discharge home, recommend the following:   A lot of help with walking and/or transfers;A lot of help with bathing/dressing/bathroom;Assistance with cooking/housework;Assist for transportation;Help with stairs or ramp for entrance     Functional Status Assessment   Patient has had a recent decline in their functional status and demonstrates the ability to make significant  improvements in function in a reasonable and predictable amount of time.     Equipment Recommendations   Other (comment) (Defer to next venue of care)     Recommendations for Other Services         Precautions/Restrictions   Precautions Precautions: Fall Recall of Precautions/Restrictions: Intact Required Braces or Orthoses: Splint/Cast Splint/Cast: RLE long leg cast with cast shoe Restrictions Weight Bearing Restrictions Per Provider Order: Yes RLE Weight Bearing Per Provider Order: Weight bearing as tolerated     Mobility Bed Mobility Overal bed mobility: Needs Assistance Bed Mobility: Supine to Sit     Supine to sit: Min assist     General bed mobility comments: Sliding RLE over to the EOB    Transfers Overall transfer level: Needs assistance Equipment used: None (BRW) Transfers: Sit to/from Stand Sit to Stand: From elevated surface, Min assist           General transfer comment: Took steps towards the recliner with CGA+BRW, cuing for technique      Balance Overall balance assessment: Needs assistance Sitting-balance support: Feet unsupported, Single extremity supported Sitting balance-Leahy Scale: Good Sitting balance - Comments: Steady reaching within BOS   Standing balance support: Bilateral upper extremity supported, Reliant on assistive device for balance, During functional activity Standing balance-Leahy Scale: Poor Standing balance comment: Reliant on RW to off load RLE                           ADL either performed or assessed with clinical judgement   ADL Overall  ADL's : Needs assistance/impaired Eating/Feeding: Set up;Sitting                   Lower Body Dressing: Maximal assistance;Bed level Lower Body Dressing Details (indicate cue type and reason): Donning cast shoes and regular sock Toilet Transfer: Ambulation;BSC/3in1;Rolling walker (2 wheels);Contact guard assist Toilet Transfer Details (indicate cue type  and reason): Simulated transfer from EOB<>recliner         Functional mobility during ADLs: Contact guard assist;Rolling walker (2 wheels);Cueing for sequencing;Cueing for safety                                  Pertinent Vitals/Pain Pain Assessment Pain Assessment: No/denies pain     Extremity/Trunk Assessment Upper Extremity Assessment Upper Extremity Assessment: Overall WFL for tasks assessed   Lower Extremity Assessment Lower Extremity Assessment: Defer to PT evaluation   Cervical / Trunk Assessment Cervical / Trunk Assessment: Normal   Communication Communication Communication: No apparent difficulties   Cognition Arousal: Alert Behavior During Therapy: WFL for tasks assessed/performed Cognition: No apparent impairments             OT - Cognition Comments: A/Ox4                 Following commands: Intact       Cueing  General Comments   Cueing Techniques: Verbal cues  Wife at pt bedside   Exercises Exercises: Other exercises Other Exercises Other Exercises: Edu: Role of OT, safe ADL completion, discharge planning, DME requirments   Shoulder Instructions      Home Living Family/patient expects to be discharged to:: Private residence Living Arrangements: Alone Available Help at Discharge: Available PRN/intermittently;Family Type of Home: House Home Access: Level entry     Home Layout: One level     Bathroom Shower/Tub: Chief Strategy Officer: Standard     Home Equipment: Cane - single point   Additional Comments: Reports he will have temporary grab bars installed      Prior Functioning/Environment Prior Level of Function : Independent/Modified Independent             Mobility Comments: Using RW since TKA ADLs Comments: MODI, wife assist as needed post TKA    OT Problem List: Decreased range of motion;Decreased strength;Decreased activity tolerance;Impaired balance (sitting and/or  standing);Decreased coordination;Decreased safety awareness;Decreased knowledge of use of DME or AE;Decreased knowledge of precautions;Pain   OT Treatment/Interventions: Self-care/ADL training;Therapeutic exercise;Energy conservation;DME and/or AE instruction;Therapeutic activities;Patient/family education;Balance training      OT Goals(Current goals can be found in the care plan section)   Acute Rehab OT Goals Patient Stated Goal: go to rehab to heal OT Goal Formulation: With patient/family Time For Goal Achievement: 04/18/24 Potential to Achieve Goals: Good ADL Goals Pt Will Perform Grooming: with modified independence;standing Pt Will Perform Lower Body Dressing: with modified independence;sit to/from stand Pt Will Transfer to Toilet: with modified independence;ambulating Pt Will Perform Toileting - Clothing Manipulation and hygiene: with modified independence   OT Frequency:  Min 2X/week    Co-evaluation              AM-PAC OT 6 Clicks Daily Activity     Outcome Measure Help from another person eating meals?: None Help from another person taking care of personal grooming?: A Little Help from another person toileting, which includes using toliet, bedpan, or urinal?: A Lot Help from another person bathing (including washing, rinsing, drying)?: A  Lot Help from another person to put on and taking off regular upper body clothing?: None Help from another person to put on and taking off regular lower body clothing?: A Lot 6 Click Score: 17   End of Session Equipment Utilized During Treatment: Gait belt;Rolling walker (2 wheels) Nurse Communication: Mobility status  Activity Tolerance: Patient tolerated treatment well Patient left: in chair;with call bell/phone within reach;with chair alarm set;with family/visitor present  OT Visit Diagnosis: Unsteadiness on feet (R26.81);Other abnormalities of gait and mobility (R26.89);Muscle weakness (generalized) (M62.81);Pain Pain -  Right/Left: Right Pain - part of body: Leg                Time: 1110-1139 OT Time Calculation (min): 29 min Charges:  OT General Charges $OT Visit: 1 Visit OT Evaluation $OT Eval Moderate Complexity: 1 Mod OT Treatments $Therapeutic Activity: 8-22 mins  Larraine Colas M.S. OTR/L  04/04/24, 1:45 PM

## 2024-04-04 NOTE — [place] FL2 (Signed)
 [place]  MEDICAID FL2 LEVEL OF CARE FORM     IDENTIFICATION  Patient Name: Mathew Porter Birthdate: [birth year] Sex: male Admission Date (Current Location): 04/02/2024  [facility] and [place] Number:  Chiropodist and Address:  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, right 03/04/2024   Lymphedema 01/31/2019   Diabetes (HCC) 01/28/2019   Swelling of limb 01/28/2019   Left leg cellulitis 11/05/2018   Anxiety 05/23/2018   Chickenpox 05/23/2018   Colon polyp 05/23/2018   Diverticulosis 05/23/2018   Headache 05/23/2018   Hypertension 05/23/2018   OAB (overactive bladder) 05/23/2018   Morbid obesity with BMI of 40.0-44.9, adult (HCC) 12/21/2015    Orientation RESPIRATION BLADDER Height & Weight        O2 (Nasal Cannula 2 L) Continent Weight: (!) 330 lb (149.7 kg) Height:  5' 11 (180.3 cm)  BEHAVIORAL SYMPTOMS/MOOD NEUROLOGICAL BOWEL NUTRITION STATUS      Continent Diet (Heart Healthy)  AMBULATORY STATUS COMMUNICATION OF NEEDS Skin   Extensive Assist Verbally Surgical wounds (Surgical closed incision right knee)                       Personal Care Assistance Level of Assistance  Bathing, Feeding, Dressing Bathing Assistance: Limited assistance Feeding assistance: Independent Dressing Assistance: Limited assistance     Functional Limitations Info  Sight Sight Info: Impaired        SPECIAL CARE FACTORS FREQUENCY                        Contractures      Additional Factors Info  Code Status, Allergies Code Status Info: Full Allergies Info: Doxycycline            Current Medications (04/04/2024):  This is the current hospital active medication list Current Facility-Administered Medications  Medication Dose Route Frequency Provider Last Rate Last Admin   acetaminophen  (TYLENOL ) tablet 1,000 mg  1,000 mg Oral Q6H Poggi, Norleen PARAS, MD   1,000 mg at 04/04/24 1110   acetaminophen  (TYLENOL ) tablet 325-650 mg  325-650 mg Oral Q6H PRN Poggi, John J, MD       albuterol  (PROVENTIL ) (2.5 MG/3ML) 0.083% nebulizer solution 2.5 mg  2.5 mg Nebulization Q2H PRN Poggi, John J, MD       apixaban  (ELIQUIS ) tablet 2.5 mg  2.5 mg Oral BID Poggi, John J, MD   2.5 mg at 04/04/24 9073   bisacodyl  (DULCOLAX) suppository 10 mg  10 mg Rectal Daily PRN Poggi, John J, MD       diphenhydrAMINE  (BENADRYL ) 12.5 MG/5ML elixir 12.5-25 mg  12.5-25 mg Oral Q4H PRN Poggi, John J, MD       docusate sodium  (COLACE) capsule 100 mg  100 mg Oral BID Poggi, John J, MD   100 mg at 04/04/24 9073   HYDROmorphone  (DILAUDID ) injection 0.5-1 mg  0.5-1 mg Intravenous Q2H PRN Poggi, Norleen PARAS, MD  insulin  aspart (novoLOG ) injection 0-20 Units  0-20 Units Subcutaneous TID WC Poggi, John J, MD   4 Units at 04/04/24 1230   ketorolac  (TORADOL ) 15 MG/ML injection 7.5 mg  7.5 mg Intravenous Q6H Poggi, John J, MD   7.5 mg at 04/04/24 1110   lamoTRIgine  (LAMICTAL  XR) 24 hour tablet 100 mg  100 mg Oral QHS Poggi, John J, MD   100 mg at 04/03/24 2153   magnesium  hydroxide (MILK OF MAGNESIA) suspension 30 mL  30 mL Oral Daily PRN Poggi, John J, MD       metoCLOPramide  (REGLAN ) tablet 5-10 mg  5-10 mg Oral Q8H PRN Poggi, John J, MD       Or   metoCLOPramide  (REGLAN ) injection 5-10 mg  5-10 mg Intravenous Q8H PRN Poggi, John J, MD       ondansetron  (ZOFRAN ) tablet 4 mg  4 mg Oral Q6H PRN Poggi, John J, MD       Or   ondansetron  (ZOFRAN ) injection 4 mg  4 mg  Intravenous Q6H PRN Poggi, John J, MD       oxyCODONE  (Oxy IR/ROXICODONE ) immediate release tablet 5-10 mg  5-10 mg Oral Q4H PRN Poggi, John J, MD   10 mg at 04/04/24 0751   [START ON 04/05/2024] pneumococcal 20-valent conjugate vaccine (PREVNAR 20 ) injection 0.5 mL  0.5 mL Intramuscular Tomorrow-1000 Wouk, Devaughn Sayres, MD       polyethylene glycol (MIRALAX  / GLYCOLAX ) packet 34 g  34 g Oral Daily Wouk, Devaughn Sayres, MD       simvastatin  (ZOCOR ) tablet 20 mg  20 mg Oral QHS Poggi, John J, MD   20 mg at 04/03/24 2145   sodium phosphate  (FLEET) enema 1 enema  1 enema Rectal Once PRN Poggi, Norleen PARAS, MD       SUMAtriptan  (IMITREX ) tablet 50 mg  50 mg Oral Daily PRN Poggi, John J, MD       verapamil  (CALAN -SR) CR tablet 240 mg  240 mg Oral QPM Poggi, Norleen PARAS, MD   240 mg at 04/02/24 2008     Discharge Medications: Please see discharge summary for a list of discharge medications.  Relevant Imaging Results:  Relevant Lab Results:   Additional Information SSN: [id number]  Mathew Petko  [place], LCSW

## 2024-04-04 NOTE — Progress Notes (Signed)
  Subjective: 2 Days Post-Op Procedure(s) (LRB): REPAIR, TENDON, QUADRICEPS (Right) Patient reports pain as mild.   Patient is well, and has had no acute complaints or problems Plan is to go Home after hospital stay. Negative for chest pain and shortness of breath Fever: no Gastrointestinal: Negative for nausea and vomiting TOC looking for placement to SNF  Objective: Vital signs in last 24 hours: Temp:  [97.7 F (36.5 C)-98.4 F (36.9 C)] 97.7 F (36.5 C) (08/16 0728) Pulse Rate:  [98-110] 98 (08/16 0728) Resp:  [16-18] 18 (08/16 0728) BP: (125-140)/(59-82) 126/82 (08/16 0728) SpO2:  [95 %-97 %] 97 % (08/16 0728)  Intake/Output from previous day:  Intake/Output Summary (Last 24 hours) at 04/05/2024 0840 Last data filed at 04/05/2024 0723 Gross per 24 hour  Intake 1422.64 ml  Output 650 ml  Net 772.64 ml    Intake/Output this shift: Total I/O In: -  Out: 350 [Urine:350]  Labs: Recent Labs    04/02/24 1645 04/03/24 0551 04/04/24 0418 04/05/24 0334  HGB 14.3 12.2* 12.6* 11.9*   Recent Labs    04/04/24 0418 04/05/24 0334  WBC 8.3 8.3  RBC 4.23 4.07*  HCT 38.7* 37.7*  PLT 316 308   Recent Labs    04/03/24 0551 04/04/24 0418  NA 135 136  K 4.1 4.6  CL 101 103  CO2 26 25  BUN 18 12  CREATININE 0.85 0.71  GLUCOSE 140* 159*  CALCIUM  8.5* 8.4*   No results for input(s): LABPT, INR in the last 72 hours.   EXAM General - Patient is Alert and Oriented Extremity - Neurovascular intact Sensation intact distally Dorsiflexion/Plantar flexion intact Dressing/Incision -the long-leg cast is in place and fitting well Motor Function - intact, moving toes well on exam.   Past Medical History:  Diagnosis Date   Anemia    Anxiety    Asthma    Cellulitis    Diabetes mellitus without complication (HCC)    Dyspnea    Headache    HTN (hypertension)    Obesity    Pneumonia    Primary osteoarthritis of right knee    Pyogenic granuloma of skin    Sleep  apnea     Assessment/Plan: 2 Days Post-Op Procedure(s) (LRB): REPAIR, TENDON, QUADRICEPS (Right) Principal Problem:   Quadriceps tendon rupture Active Problems:   Hypertension   Morbid obesity with BMI of 40.0-44.9, adult (HCC)   Diabetes (HCC)  Estimated body mass index is 46.03 kg/m as calculated from the following:   Height as of this encounter: 5' 11 (1.803 m).   Weight as of this encounter: 149.7 kg. Advance diet Up with therapy  Discharge planning: Discharge home when safe with physical therapy, TOC looking for SNF Follow-up with [facility] clinic orthopedics in 2 weeks for cast and staple removal No showering.  Cast in place - good cast care.  Labs and VSS. Hgb at 11.9  DVT Prophylaxis - Eliquis  Weight-Bearing as tolerated to right leg with cast shoe as dicussed with PT  Fonda CHARLENA Koyanagi, PA-C Orthopaedic Surgery 04/05/2024, 8:40 AM

## 2024-04-04 NOTE — Care Management Important Message (Signed)
 Important Message  Patient Details  Name: Mathew Porter MRN: [id number] Date of Birth: 04/15/1956   Important Message Given:  Yes - Medicare IM     [name], CMA 04/04/2024, 1:02 PM

## 2024-04-04 NOTE — Discharge Instructions (Addendum)
 INSTRUCTIONS AFTER Surgery  Remove items at home which could result in a fall. This includes throw rugs or furniture in walking pathways ICE to the affected joint every three hours while awake for 30 minutes at a time, for at least the first 3-5 days, and then as needed for pain and swelling.  Continue to use ice for pain and swelling. You may notice swelling that will progress down to the foot and ankle.  This is normal after surgery.  Elevate your leg when you are not up walking on it.   Continue to use the breathing machine you got in the hospital (incentive spirometer) which will help keep your temperature down.  It is common for your temperature to cycle up and down following surgery, especially at night when you are not up moving around and exerting yourself.  The breathing machine keeps your lungs expanded and your temperature down.   DIET:  As you were doing prior to hospitalization, we recommend a well-balanced diet.  DRESSING / WOUND CARE / SHOWERING  Good cast care.  No showering or getting wet.  Follow-up with [facility] clinic orthopedics in 2 weeks for staple removal out of the cast.  ACTIVITY  Increase activity slowly as tolerated, but follow the weight bearing instructions below.   No driving for 6 weeks or until further direction given by your physician.  You cannot drive while taking narcotics.  No lifting or carrying greater than 10 lbs. until further directed by your surgeon. Avoid periods of inactivity such as sitting longer than an hour when not asleep. This helps prevent blood clots.  You may return to work once you are authorized by your doctor.     WEIGHT BEARING  Weightbearing as tolerated on the right   EXERCISES Ambulation with the walker  CONSTIPATION  Constipation is defined medically as fewer than three stools per week and severe constipation as less than one stool per week.  Even if you have a regular bowel pattern at home, your normal regimen is likely  to be disrupted due to multiple reasons following surgery.  Combination of anesthesia, postoperative narcotics, change in appetite and fluid intake all can affect your bowels.   YOU MUST use at least one of the following options; they are listed in order of increasing strength to get the job done.  They are all available over the counter, and you may need to use some, POSSIBLY even all of these options:    Drink plenty of fluids (prune juice may be helpful) and high fiber foods Colace 100 mg by mouth twice a day  Senokot for constipation as directed and as needed Dulcolax (bisacodyl ), take with full glass of water  Miralax  (polyethylene glycol) once or twice a day as needed.  If you have tried all these things and are unable to have a bowel movement in the first 3-4 days after surgery call either your surgeon or your primary doctor.    If you experience loose stools or diarrhea, hold the medications until you stool forms back up.  If your symptoms do not get better within 1 week or if they get worse, check with your doctor.  If you experience the worst abdominal pain ever or develop nausea or vomiting, please contact the office immediately for further recommendations for treatment.   ITCHING:  If you experience itching with your medications, try taking only a single pain pill, or even half a pain pill at a time.  You can also use Benadryl   over the counter for itching or also to help with sleep.   TED HOSE STOCKINGS:  Use stockings on both legs until for at least 2 weeks or as directed by physician office. They may be removed at night for sleeping.  MEDICATIONS:  See your medication summary on the "After Visit Summary" that nursing will review with you.  You may have some home medications which will be placed on hold until you complete the course of blood thinner medication.  It is important for you to complete the blood thinner medication as prescribed.  PRECAUTIONS:  If you experience chest  pain or shortness of breath - call 911 immediately for transfer to the hospital emergency department.   If you develop a fever greater that 101 F, purulent drainage from wound, increased redness or drainage from wound, foul odor from the wound/dressing, or calf pain - CONTACT YOUR SURGEON.                                                   FOLLOW-UP APPOINTMENTS:  If you do not already have a post-op appointment, please call the office for an appointment to be seen by your surgeon.  Guidelines for how soon to be seen are listed in your "After Visit Summary", but are typically between 1-4 weeks after surgery.  OTHER INSTRUCTIONS:     MAKE SURE YOU:  Understand these instructions.  Get help right away if you are not doing well or get worse.    Thank you for letting us  be a part of your medical care team.  It is a privilege we respect greatly.  We hope these instructions will help you stay on track for a fast and full recovery!    Information on my medicine - ELIQUIS  (apixaban )  This medication education was reviewed with me or my healthcare representative as part of my discharge preparation.  The pharmacist that spoke with me during my hospital stay was:    Why was Eliquis  prescribed for you? Eliquis  was prescribed for you to reduce the risk of blood clots forming after orthopedic surgery.    What do You need to know about Eliquis ? Take your Eliquis  TWICE DAILY - one tablet in the morning and one tablet in the evening with or without food.  It would be best to take the dose about the same time each day.  If you have difficulty swallowing the tablet whole please discuss with your pharmacist how to take the medication safely.  Take Eliquis  exactly as prescribed by your doctor and DO NOT stop taking Eliquis  without talking to the doctor who prescribed the medication.  Stopping without other medication to take the place of Eliquis  may increase your risk of developing a clot.  After  discharge, you should have regular check-up appointments with your healthcare provider that is prescribing your Eliquis .  What do you do if you miss a dose? If a dose of ELIQUIS  is not taken at the scheduled time, take it as soon as possible on the same day and twice-daily administration should be resumed.  The dose should not be doubled to make up for a missed dose.  Do not take more than one tablet of ELIQUIS  at the same time.  Important Safety Information A possible side effect of Eliquis  is bleeding. You should call your healthcare provider right away  if you experience any of the following: Bleeding from an injury or your nose that does not stop. Unusual colored urine (red or dark brown) or unusual colored stools (red or black). Unusual bruising for unknown reasons. A serious fall or if you hit your head (even if there is no bleeding).  Some medicines may interact with Eliquis  and might increase your risk of bleeding or clotting while on Eliquis . To help avoid this, consult your healthcare provider or pharmacist prior to using any new prescription or non-prescription medications, including herbals, vitamins, non-steroidal anti-inflammatory drugs (NSAIDs) and supplements.  This website has more information on Eliquis  (apixaban ): http://[email or web address] .com/eliquis dena

## 2024-04-04 NOTE — Anesthesia Postprocedure Evaluation (Signed)
 Anesthesia Post Note  Patient: Mathew Porter  Procedure(s) Performed: REPAIR, TENDON, QUADRICEPS (Right)  Patient location during evaluation: PACU Anesthesia Type: General Level of consciousness: awake and alert Pain management: pain level controlled Vital Signs Assessment: post-procedure vital signs reviewed and stable Respiratory status: spontaneous breathing, nonlabored ventilation, respiratory function stable and patient connected to nasal cannula oxygen Cardiovascular status: blood pressure returned to baseline and stable Postop Assessment: no apparent nausea or vomiting Anesthetic complications: no   No notable events documented.   Last Vitals:  Vitals:   04/03/24 2026 04/03/24 2345  BP: (!) 153/89   Pulse: (!) 109   Resp: (!) 24 20  Temp: (!) 36.4 C   SpO2: 97%     Last Pain:  Vitals:   04/04/24 0045  TempSrc:   PainSc: Asleep                 Lendia LITTIE Mae

## 2024-04-04 NOTE — Progress Notes (Signed)
 PROGRESS NOTE    Mathew Porter  FMW:[id number] DOB: [birth year] DOA: 04/02/2024 PCP: Valora Agent, MD  Outpatient Specialists: orthopedics    Brief Narrative:   From admission h and p Mathew Porter is a 68 y.o. male with medical history significant of Anemia, Anxiety, Asthma, DmII, HA , HTN,obesity, OSA, in addition to interim hx of R TKA Dr. Edie  on 03/04/2024.  Patient presents to ED s/p mechanical fall with injury to right Knee. Patient notes no other injury. Denies head strike. Patient on ros noted no n/v/d/sob/chest pain / abdominal pain / fever/ or chills. Notes he was at his baseline prior to fall. Patient states he believes he slipped while walking. He notes is pain is a 2 at rest but increase to a max of 10 with  movement.     Assessment & Plan:   Principal Problem:   Quadriceps tendon rupture Active Problems:   Hypertension   Morbid obesity with BMI of 40.0-44.9, adult (HCC)   Diabetes (HCC)  # Right quadriceps tendon rupture After trip and fall, in setting of recent right TKA. Now POD1 from primary repair of quadriceps tendon rupture and medial retinacula dehiscence - pain control, bowel regimen - [facility] f/u 2 weeks - no showering - WBAT - PT advising snf, toc consulted   # s/p right total knee replacement Last month - outpt f/u  # Seizure disorder - home lamictal   # T2DM Glucose appropriate - SSI  # HTN Bp controlled - home verapamil , statin  # Obesity Noted  # OSA - cpap qhs   DVT prophylaxis: apixaban  Code Status: full Family Communication: wife at bedside 8/14  Level of care: Med-Surg Status is: Inpatient Remains inpatient appropriate because: pending snf placement    Consultants:  orthopedics  Procedures: See above     Subjective: Reports stable right leg pain, mild and controlled. No bm yet  Objective: Vitals:   04/03/24 2026 04/03/24 2345 04/04/24 0440 04/04/24 0738  BP: (!) 153/89  128/86 (!) 143/80   Pulse: (!) 109  (!) 109   Resp: (!) 24 20 (!) 24 16  Temp: (!) 97.5 F (36.4 C)  97.7 F (36.5 C) 98.6 F (37 C)  TempSrc: Oral  Oral Oral  SpO2: 97%  95% 100%  Weight:      Height:        Intake/Output Summary (Last 24 hours) at 04/04/2024 1237 Last data filed at 04/04/2024 0900 Gross per 24 hour  Intake 1244.13 ml  Output 1350 ml  Net -105.87 ml   Filed Weights   04/02/24 1300 04/03/24 1425  Weight: (!) 152 kg (!) 149.7 kg    Examination:  General exam: Appears calm and comfortable  Respiratory system: Clear to auscultation. Respiratory effort normal. Cardiovascular system: S1 & S2 heard, RRR.   Gastrointestinal system: Abdomen is obese, soft and nontender.   Central nervous system: Alert and oriented. No focal neurological deficits. Extremities: right knee swelling, healed incision. Right thigh swelling Skin: No rashes, lesions or ulcers Psychiatry: Judgement and insight appear normal. Mood & affect appropriate.     Data Reviewed: I have personally reviewed following labs and imaging studies  CBC: Recent Labs  Lab 04/02/24 1645 04/03/24 0551 04/04/24 0418  WBC 13.7* 8.5 8.3  NEUTROABS 10.9*  --   --   HGB 14.3 12.2* 12.6*  HCT 43.8 37.5* 38.7*  MCV 90.9 91.0 91.5  PLT 396 316 316   Basic Metabolic Panel: Recent Labs  Lab 04/02/24  1645 04/03/24 0551 04/04/24 0418  NA 134* 135 136  K 4.3 4.1 4.6  CL 101 101 103  CO2 21* 26 25  GLUCOSE 164* 140* 159*  BUN 15 18 12   CREATININE 0.85 0.85 0.71  CALCIUM  9.1 8.5* 8.4*   GFR: Estimated Creatinine Clearance: 133.2 mL/min (by C-G formula based on SCr of 0.71 mg/dL). Liver Function Tests: Recent Labs  Lab 04/02/24 1645 04/03/24 0551  AST 22 18  ALT 21 17  ALKPHOS 82 68  BILITOT 0.8 0.8  PROT 7.6 6.6  ALBUMIN 3.8 3.1*   No results for input(s): LIPASE, AMYLASE in the last 168 hours. No results for input(s): AMMONIA in the last 168 hours. Coagulation Profile: No results for input(s):  INR, PROTIME in the last 168 hours. Cardiac Enzymes: No results for input(s): CKTOTAL, CKMB, CKMBINDEX, TROPONINI in the last 168 hours. BNP (last 3 results) No results for input(s): PROBNP in the last 8760 hours. HbA1C: Recent Labs    04/02/24 1645  HGBA1C 6.6*   CBG: Recent Labs  Lab 04/03/24 1152 04/03/24 1438 04/03/24 1903 04/04/24 0748 04/04/24 1109  GLUCAP 119* 121* 162* 149* 158*   Lipid Profile: No results for input(s): CHOL, HDL, LDLCALC, TRIG, CHOLHDL, LDLDIRECT in the last 72 hours. Thyroid Function Tests: No results for input(s): TSH, T4TOTAL, FREET4, T3FREE, THYROIDAB in the last 72 hours. Anemia Panel: No results for input(s): VITAMINB12, FOLATE, FERRITIN, TIBC, IRON, RETICCTPCT in the last 72 hours. Urine analysis:    Component Value Date/Time   COLORURINE YELLOW (A) 02/21/2024 1356   APPEARANCEUR CLEAR (A) 02/21/2024 1356   APPEARANCEUR Clear 05/23/2018 1043   LABSPEC 1.020 02/21/2024 1356   PHURINE 5.0 02/21/2024 1356   GLUCOSEU NEGATIVE 02/21/2024 1356   HGBUR NEGATIVE 02/21/2024 1356   BILIRUBINUR NEGATIVE 02/21/2024 1356   BILIRUBINUR Negative 05/23/2018 1043   KETONESUR NEGATIVE 02/21/2024 1356   PROTEINUR NEGATIVE 02/21/2024 1356   NITRITE NEGATIVE 02/21/2024 1356   LEUKOCYTESUR NEGATIVE 02/21/2024 1356   Sepsis Labs: @LABRCNTIP (procalcitonin:4,lacticidven:4)  )No results found for this or any previous visit (from the past 240 hours).       Radiology Studies: DG Chest Port 1 View Result Date: 04/02/2024 CLINICAL DATA:  Leukocytosis EXAM: PORTABLE CHEST 1 VIEW COMPARISON:  11/07/2018 FINDINGS: Cardiac shadow is within normal limits. The lungs are well aerated bilaterally. No focal infiltrate or sizable effusion is noted. The bony structures are within normal limits. IMPRESSION: No active disease. Electronically Signed   By: Oneil Devonshire M.D.   On: 04/02/2024 22:21   US  Venous Img Lower  Unilateral Right Result Date: 04/02/2024 CLINICAL DATA:  Right leg swelling EXAM: RIGHT LOWER EXTREMITY VENOUS DOPPLER ULTRASOUND TECHNIQUE: Gray-scale sonography with graded compression, as well as color Doppler and duplex ultrasound were performed to evaluate the lower extremity deep venous systems from the level of the common femoral vein and including the common femoral, femoral, profunda femoral, popliteal and calf veins including the posterior tibial, peroneal and gastrocnemius veins when visible. The superficial great saphenous vein was also interrogated. Spectral Doppler was utilized to evaluate flow at rest and with distal augmentation maneuvers in the common femoral, femoral and popliteal veins. COMPARISON:  None Available. FINDINGS: Contralateral Common Femoral Vein: Respiratory phasicity is normal and symmetric with the symptomatic side. No evidence of thrombus. Normal compressibility. Common Femoral Vein: No evidence of thrombus. Normal compressibility, respiratory phasicity and response to augmentation. Saphenofemoral Junction: No evidence of thrombus. Normal compressibility and flow on color Doppler imaging. Profunda Femoral Vein: No  evidence of thrombus. Normal compressibility and flow on color Doppler imaging. Femoral Vein: No evidence of thrombus. Normal compressibility, respiratory phasicity and response to augmentation. Popliteal Vein: No evidence of thrombus. Normal compressibility, respiratory phasicity and response to augmentation. Calf Veins: No evidence of thrombus. Normal compressibility and flow on color Doppler imaging. Superficial Great Saphenous Vein: No evidence of thrombus. Normal compressibility. Venous Reflux:  None. Other Findings:  None. IMPRESSION: No evidence of deep venous thrombosis within the RIGHT lower extremity. Electronically Signed   By: Suzen Dials M.D.   On: 04/02/2024 19:59   DG Femur Min 2 Views Right Result Date: 04/02/2024 CLINICAL DATA:  Pain after fall.  Right femur pain. Fell at home. Knee replacement 03/04/24 EXAM: RIGHT FEMUR 2 VIEWS COMPARISON:  None Available. FINDINGS: No acute fracture. Knee arthroplasty is intact. No periprosthetic lucency. Hip alignment is maintained. There is soft tissue edema about the distal thigh and knee. IMPRESSION: 1. No acute fracture of the right femur. 2. Intact knee arthroplasty. 3. Soft tissue edema about the distal thigh and knee. Electronically Signed   By: Andrea Gasman M.D.   On: 04/02/2024 14:08        Scheduled Meds:  acetaminophen   1,000 mg Oral Q6H   apixaban   2.5 mg Oral BID   docusate sodium   100 mg Oral BID   insulin  aspart  0-20 Units Subcutaneous TID WC   ketorolac   7.5 mg Intravenous Q6H   lamoTRIgine   100 mg Oral QHS   [START ON 04/05/2024] pneumococcal 20-valent conjugate vaccine  0.5 mL Intramuscular Tomorrow-1000   simvastatin   20 mg Oral QHS   verapamil   240 mg Oral QPM   Continuous Infusions:  sodium chloride  75 mL/hr at 04/04/24 1106     LOS: 2 days     Mathew KATHEE Ban, MD [facility]   If 7PM-7AM, please contact night-coverage [email or web address] Password [facility] 04/04/2024, 12:37 PM

## 2024-04-04 NOTE — Progress Notes (Signed)
  Subjective: 1 Day Post-Op Procedure(s) (LRB): REPAIR, TENDON, QUADRICEPS (Right) Patient reports pain as mild.   Patient is well, and has had no acute complaints or problems Plan is to go Home after hospital stay. Negative for chest pain and shortness of breath Fever: no Gastrointestinal: Negative for nausea and vomiting  Objective: Vital signs in last 24 hours: Temp:  [97.5 F (36.4 C)-98 F (36.7 C)] 97.7 F (36.5 C) (08/15 0440) Pulse Rate:  [99-109] 109 (08/15 0440) Resp:  [16-24] 24 (08/15 0440) BP: (128-173)/(75-98) 128/86 (08/15 0440) SpO2:  [93 %-98 %] 95 % (08/15 0440) Weight:  [149.7 kg] 149.7 kg (08/14 1425)  Intake/Output from previous day:  Intake/Output Summary (Last 24 hours) at 04/04/2024 0716 Last data filed at 04/04/2024 9372 Gross per 24 hour  Intake 764.13 ml  Output 1750 ml  Net -985.87 ml    Intake/Output this shift: No intake/output data recorded.  Labs: Recent Labs    04/02/24 1645 04/03/24 0551 04/04/24 0418  HGB 14.3 12.2* 12.6*   Recent Labs    04/03/24 0551 04/04/24 0418  WBC 8.5 8.3  RBC 4.12* 4.23  HCT 37.5* 38.7*  PLT 316 316   Recent Labs    04/03/24 0551 04/04/24 0418  NA 135 136  K 4.1 4.6  CL 101 103  CO2 26 25  BUN 18 12  CREATININE 0.85 0.71  GLUCOSE 140* 159*  CALCIUM  8.5* 8.4*   No results for input(s): LABPT, INR in the last 72 hours.   EXAM General - Patient is Alert and Oriented Extremity - Neurovascular intact Sensation intact distally Dorsiflexion/Plantar flexion intact Dressing/Incision -the long-leg cast is in place and fitting well Motor Function - intact, moving toes well on exam.   Past Medical History:  Diagnosis Date   Anemia    Anxiety    Asthma    Cellulitis    Diabetes mellitus without complication (HCC)    Dyspnea    Headache    HTN (hypertension)    Obesity    Pneumonia    Primary osteoarthritis of right knee    Pyogenic granuloma of skin    Sleep apnea      Assessment/Plan: 1 Day Post-Op Procedure(s) (LRB): REPAIR, TENDON, QUADRICEPS (Right) Principal Problem:   Quadriceps tendon rupture Active Problems:   Hypertension   Morbid obesity with BMI of 40.0-44.9, adult (HCC)   Diabetes (HCC)  Estimated body mass index is 46.03 kg/m as calculated from the following:   Height as of this encounter: 5' 11 (1.803 m).   Weight as of this encounter: 149.7 kg. Advance diet Up with therapy  Discharge planning: Discharge home when safe with physical therapy and independent. Follow-up with [facility] clinic orthopedics in 2 weeks for staple removal No showering.  Good cast care.  DVT Prophylaxis -Eliquis  Weight-Bearing as tolerated to right leg  Krystal Doyne, PA-C Orthopaedic Surgery 04/04/2024, 7:16 AM

## 2024-04-04 NOTE — Telephone Encounter (Signed)
 Patient Product/process development scientist completed.    The patient is insured through [facility]. Patient has Medicare and is not eligible for a copay card, but may be able to apply for patient assistance or Medicare RX Payment Plan (Patient Must reach out to their plan, if eligible for payment plan), if available.    Ran test claim for Eliquis  2.5 mg and the current 30 day co-pay is $45.00.   This test claim was processed through [facility]- copay amounts may vary at other pharmacies due to pharmacy/plan contracts, or as the patient moves through the different stages of their insurance plan.     Reyes Sharps, CPHT Pharmacy Technician III Certified Patient Advocate [facility] Patient Advocate Team Direct Number: [phone]  Fax: [phone]

## 2024-04-04 NOTE — TOC Progression Note (Signed)
 Transition of Care [facility]) - Progression Note    Patient Details  Name: Mathew Porter MRN: [id number] Date of Birth: [birth year]  [facility]) CM/SW Contact  Alvaro Louder, [place] Phone Number: 04/04/2024, 4:14 PM  Clinical Narrative:   LCSWA met patient at bedside and gave SNF options. LCSWA Educated on SNF vs HH. Patient indicated that he will review options and have an answer on the weekend. TOC to follow up on choice.   TOC to follow for discharge                       Expected Discharge Plan and Services                                               Social Drivers of Health (SDOH) Interventions SDOH Screenings   Food Insecurity: No Food Insecurity (04/02/2024)  Housing: Low Risk  (04/02/2024)  Transportation Needs: No Transportation Needs (04/02/2024)  Utilities: Not At Risk (04/02/2024)  Financial Resource Strain: Low Risk  (03/19/2024)   Received from [facility]  Social Connections: Moderately Isolated (04/02/2024)  Tobacco Use: Medium Risk (04/03/2024)    Readmission Risk Interventions     No data to display

## 2024-04-05 ENCOUNTER — Encounter: Payer: Self-pay | Admitting: Surgery

## 2024-04-05 DIAGNOSIS — S76111A Strain of right quadriceps muscle, fascia and tendon, initial encounter: Secondary | ICD-10-CM | POA: Diagnosis not present

## 2024-04-05 LAB — GLUCOSE, CAPILLARY
Glucose-Capillary: 132 mg/dL — ABNORMAL HIGH (ref 70–99)
Glucose-Capillary: 107 mg/dL — ABNORMAL HIGH (ref 70–99)
Glucose-Capillary: 122 mg/dL — ABNORMAL HIGH (ref 70–99)
Glucose-Capillary: 138 mg/dL — ABNORMAL HIGH (ref 70–99)

## 2024-04-05 LAB — CBC
HCT: 37.7 % — ABNORMAL LOW (ref 39.0–52.0)
Hemoglobin: 11.9 g/dL — ABNORMAL LOW (ref 13.0–17.0)
MCH: 29.2 pg (ref 26.0–34.0)
MCHC: 31.6 g/dL (ref 30.0–36.0)
MCV: 92.6 fL (ref 80.0–100.0)
Platelets: 308 K/uL (ref 150–400)
RBC: 4.07 MIL/uL — ABNORMAL LOW (ref 4.22–5.81)
RDW: 14.9 % (ref 11.5–15.5)
WBC: 8.3 K/uL (ref 4.0–10.5)
nRBC: 0 % (ref 0.0–0.2)

## 2024-04-05 MED ORDER — LACTULOSE 10 GM/15ML PO SOLN
30.0000 g | Freq: Once | ORAL | Status: AC
  Administered 2024-04-05: 30 g via ORAL
  Filled 2024-04-05: qty 60

## 2024-04-05 MED ORDER — SENNA 8.6 MG PO TABS
1.0000 | ORAL_TABLET | Freq: Every day | ORAL | Status: DC
  Administered 2024-04-05 – 2024-04-08 (×3): 8.6 mg via ORAL
  Filled 2024-04-05 (×4): qty 1

## 2024-04-05 NOTE — Progress Notes (Signed)
 PROGRESS NOTE    Mathew Porter  FMW:[id number] DOB: [birth year] DOA: 04/02/2024 PCP: Valora Agent, MD  Outpatient Specialists: orthopedics    Brief Narrative:   From admission h and p Mathew Porter is a 68 y.o. male with medical history significant of Anemia, Anxiety, Asthma, DmII, HA , HTN,obesity, OSA, in addition to interim hx of R TKA Dr. Edie  on 03/04/2024.  Patient presents to ED s/p mechanical fall with injury to right Knee. Patient notes no other injury. Denies head strike. Patient on ros noted no n/v/d/sob/chest pain / abdominal pain / fever/ or chills. Notes he was at his baseline prior to fall. Patient states he believes he slipped while walking. He notes is pain is a 2 at rest but increase to a max of 10 with  movement.     Assessment & Plan:   Principal Problem:   Quadriceps tendon rupture Active Problems:   Hypertension   Morbid obesity with BMI of 40.0-44.9, adult (HCC)   Diabetes (HCC)  # Right quadriceps tendon rupture After trip and fall, in setting of recent right TKA. Now POD2 from primary repair of quadriceps tendon rupture and medial retinacula dehiscence - pain control, bowel regimen - [facility] f/u 2 weeks - no showering - WBAT - PT advising snf, toc consulted, bed search underway  # Constipation No bm since surgery - cont miralax  - add nightly senna - lactulose  x1  # s/p right total knee replacement Last month - outpt f/u  # Seizure disorder Stable - home lamictal   # T2DM Glucose appropriate - SSI  # HTN Bp controlled - home verapamil , statin  # Obesity Noted  # OSA - cpap qhs   DVT prophylaxis: apixaban  Code Status: full Family Communication: wife at bedside 8/16  Level of care: Med-Surg Status is: Inpatient Remains inpatient appropriate because: pending snf placement    Consultants:  orthopedics  Procedures: See above     Subjective: Reports stable right leg pain, mild and controlled. No bm  yet  Objective: Vitals:   04/04/24 1110 04/04/24 1502 04/04/24 2013 04/05/24 0728  BP: (!) 140/76 (!) 139/59 125/64 126/82  Pulse:  (!) 110 98 98  Resp: 17 16 18 18   Temp: 98.4 F (36.9 C) 98.1 F (36.7 C) 98 F (36.7 C) 97.7 F (36.5 C)  TempSrc: Oral Oral Oral Oral  SpO2: 95% 96% 96% 97%  Weight:      Height:        Intake/Output Summary (Last 24 hours) at 04/05/2024 1243 Last data filed at 04/05/2024 1001 Gross per 24 hour  Intake 1422.64 ml  Output 850 ml  Net 572.64 ml   Filed Weights   04/02/24 1300 04/03/24 1425  Weight: (!) 152 kg (!) 149.7 kg    Examination:  General exam: Appears calm and comfortable  Respiratory system: Clear to auscultation. Respiratory effort normal. Cardiovascular system: S1 & S2 heard, RRR.   Gastrointestinal system: Abdomen is obese, soft and nontender.   Central nervous system: Alert and oriented. No focal neurological deficits. Extremities: right leg is in a cast. Distal sensation inttact Skin: No rashes, lesions or ulcers Psychiatry: Judgement and insight appear normal. Mood & affect appropriate.     Data Reviewed: I have personally reviewed following labs and imaging studies  CBC: Recent Labs  Lab 04/02/24 1645 04/03/24 0551 04/04/24 0418 04/05/24 0334  WBC 13.7* 8.5 8.3 8.3  NEUTROABS 10.9*  --   --   --   HGB 14.3 12.2*  12.6* 11.9*  HCT 43.8 37.5* 38.7* 37.7*  MCV 90.9 91.0 91.5 92.6  PLT 396 316 316 308   Basic Metabolic Panel: Recent Labs  Lab 04/02/24 1645 04/03/24 0551 04/04/24 0418  NA 134* 135 136  K 4.3 4.1 4.6  CL 101 101 103  CO2 21* 26 25  GLUCOSE 164* 140* 159*  BUN 15 18 12   CREATININE 0.85 0.85 0.71  CALCIUM  9.1 8.5* 8.4*   GFR: Estimated Creatinine Clearance: 133.2 mL/min (by C-G formula based on SCr of 0.71 mg/dL). Liver Function Tests: Recent Labs  Lab 04/02/24 1645 04/03/24 0551  AST 22 18  ALT 21 17  ALKPHOS 82 68  BILITOT 0.8 0.8  PROT 7.6 6.6  ALBUMIN 3.8 3.1*   No results  for input(s): LIPASE, AMYLASE in the last 168 hours. No results for input(s): AMMONIA in the last 168 hours. Coagulation Profile: No results for input(s): INR, PROTIME in the last 168 hours. Cardiac Enzymes: No results for input(s): CKTOTAL, CKMB, CKMBINDEX, TROPONINI in the last 168 hours. BNP (last 3 results) No results for input(s): PROBNP in the last 8760 hours. HbA1C: Recent Labs    04/02/24 1645  HGBA1C 6.6*   CBG: Recent Labs  Lab 04/04/24 1109 04/04/24 1644 04/04/24 2109 04/05/24 0730 04/05/24 1146  GLUCAP 158* 135* 145* 132* 107*   Lipid Profile: No results for input(s): CHOL, HDL, LDLCALC, TRIG, CHOLHDL, LDLDIRECT in the last 72 hours. Thyroid Function Tests: No results for input(s): TSH, T4TOTAL, FREET4, T3FREE, THYROIDAB in the last 72 hours. Anemia Panel: No results for input(s): VITAMINB12, FOLATE, FERRITIN, TIBC, IRON, RETICCTPCT in the last 72 hours. Urine analysis:    Component Value Date/Time   COLORURINE YELLOW (A) 02/21/2024 1356   APPEARANCEUR CLEAR (A) 02/21/2024 1356   APPEARANCEUR Clear 05/23/2018 1043   LABSPEC 1.020 02/21/2024 1356   PHURINE 5.0 02/21/2024 1356   GLUCOSEU NEGATIVE 02/21/2024 1356   HGBUR NEGATIVE 02/21/2024 1356   BILIRUBINUR NEGATIVE 02/21/2024 1356   BILIRUBINUR Negative 05/23/2018 1043   KETONESUR NEGATIVE 02/21/2024 1356   PROTEINUR NEGATIVE 02/21/2024 1356   NITRITE NEGATIVE 02/21/2024 1356   LEUKOCYTESUR NEGATIVE 02/21/2024 1356   Sepsis Labs: @LABRCNTIP (procalcitonin:4,lacticidven:4)  )No results found for this or any previous visit (from the past 240 hours).       Radiology Studies: No results found.       Scheduled Meds:  apixaban   2.5 mg Oral BID   docusate sodium   100 mg Oral BID   insulin  aspart  0-20 Units Subcutaneous TID WC   lamoTRIgine   100 mg Oral QHS   polyethylene glycol  34 g Oral Daily   simvastatin   20 mg Oral QHS   verapamil    240 mg Oral QPM   Continuous Infusions:     LOS: 3 days     Devaughn KATHEE Ban, MD [facility]   If 7PM-7AM, please contact night-coverage [email or web address] Password [facility] 04/05/2024, 12:43 PM

## 2024-04-05 NOTE — Plan of Care (Signed)

## 2024-04-05 NOTE — Progress Notes (Signed)
 Physical Therapy Treatment Patient Details Name: Mathew Porter MRN: [id number] DOB: [birth year] Today's Date: 04/05/2024   History of Present Illness Mathew Porter comes to [facility] ED on 04/02/24 after a fall. Pt is s/p Rt TKA 03/04/24. PMH: DM2, seizure-like activity, BPH, HTN, OSA. Workup revealing of quads tendon rupture. Surgical repair was done by Dr. Edie on 8/14, pt places in a long leg cast to protect tissue repair.    PT Comments  Pt in bed, breakfast finished, he reports some achiness in the surgical side knee. Pt assists with Left shoe donning and Rt postop shoe donning. Pt requires some assist with navigating leg on floor due to friction from postop shoe sole. Pt needs minA to rise to standing. Pt does better with rising to standing from recliner, but still needs minA. Pt able to begin with gait training today, maintains balance with 80ft AMB forward with BRW and shoes, but c/o discomfort from cast digging into his hip with increased weight bearing. Pt left up to chair, all needs met. RN made aware of request for pain meds.    If plan is discharge home, recommend the following: A lot of help with walking and/or transfers;Assistance with cooking/housework;Help with stairs or ramp for entrance;Assist for transportation   Can travel by private vehicle     No  Equipment Recommendations  None recommended by PT    Recommendations for Other Services       Precautions / Restrictions Precautions Precautions: Fall Recall of Precautions/Restrictions: Intact Required Braces or Orthoses: Splint/Cast Splint/Cast: RLE long leg cast with cast shoe Restrictions RLE Weight Bearing Per Provider Order: Weight bearing as tolerated     Mobility  Bed Mobility Overal bed mobility: Needs Assistance Bed Mobility: Supine to Sit     Supine to sit: Min assist, HOB elevated          Transfers Overall transfer level: Needs assistance Equipment used:  (BRW) Transfers: Sit to/from Stand, Bed  to chair/wheelchair/BSC Sit to Stand: From elevated surface, Min assist   Step pivot transfers: Contact guard assist            Ambulation/Gait Ambulation/Gait assistance: Contact guard assist Gait Distance (Feet): 9 Feet Assistive device:  (BRW) Gait Pattern/deviations: Step-to pattern Gait velocity: straight plane with chair follow; increasing discomfort with long leg cast into thigh/hip     General Gait Details: placed in surgical shoe, prior to session, no cast shoe in room or on site.   Stairs             Wheelchair Mobility     Tilt Bed    Modified Rankin (Stroke Patients Only)       Balance                                            Communication    Cognition Arousal: Alert Behavior During Therapy: WFL for tasks assessed/performed   PT - Cognitive impairments: No apparent impairments                                Cueing    Exercises      General Comments        Pertinent Vitals/Pain Pain Assessment Pain Assessment: 0-10 Pain Score: 2  Pain Location: achiness in Rt knee Pain Descriptors / Indicators: Aching Pain Intervention(s): Limited  activity within patient's tolerance, Monitored during session, Premedicated before session, Repositioned    Home Living                          Prior Function            PT Goals (current goals can now be found in the care plan section) Acute Rehab PT Goals Patient Stated Goal: regain mod independent AMB c RW PT Goal Formulation: With patient Time For Goal Achievement: 04/18/24 Potential to Achieve Goals: Good Progress towards PT goals: Progressing toward goals    Frequency    Min 2X/week      PT Plan      Co-evaluation              AM-PAC PT 6 Clicks Mobility   Outcome Measure  Help needed turning from your back to your side while in a flat bed without using bedrails?: A Lot Help needed moving from lying on your back to sitting on  the side of a flat bed without using bedrails?: A Lot Help needed moving to and from a bed to a chair (including a wheelchair)?: A Lot Help needed standing up from a chair using your arms (e.g., wheelchair or bedside chair)?: A Lot Help needed to walk in hospital room?: A Little Help needed climbing 3-5 steps with a railing? : A Lot 6 Click Score: 13    End of Session   Activity Tolerance: Patient tolerated treatment well;No increased pain;Patient limited by fatigue Patient left: in bed;with call bell/phone within reach;with family/visitor present Nurse Communication: Mobility status PT Visit Diagnosis: Difficulty in walking, not elsewhere classified (R26.2);Other abnormalities of gait and mobility (R26.89);Repeated falls (R29.6);Muscle weakness (generalized) (M62.81)     Time: 9176-9152 PT Time Calculation (min) (ACUTE ONLY): 24 min  Charges:    $Gait Training: 8-22 mins $Therapeutic Activity: 8-22 mins PT General Charges $$ ACUTE PT VISIT: 1 Visit                    9:21 AM, 04/05/24 Peggye JAYSON Linear, PT, DPT Physical Therapist - [facility]  [phone] (ASCOM)     Aureliano Oshields C 04/05/2024, 9:16 AM

## 2024-04-05 NOTE — Plan of Care (Signed)
  Problem: Education: Goal: Ability to describe self-care measures that may prevent or decrease complications (Diabetes Survival Skills Education) will improve Outcome: Progressing   Problem: Coping: Goal: Ability to adjust to condition or change in health will improve Outcome: Progressing

## 2024-04-06 DIAGNOSIS — S76111A Strain of right quadriceps muscle, fascia and tendon, initial encounter: Secondary | ICD-10-CM | POA: Diagnosis not present

## 2024-04-06 LAB — GLUCOSE, CAPILLARY
Glucose-Capillary: 129 mg/dL — ABNORMAL HIGH (ref 70–99)
Glucose-Capillary: 116 mg/dL — ABNORMAL HIGH (ref 70–99)
Glucose-Capillary: 167 mg/dL — ABNORMAL HIGH (ref 70–99)
Glucose-Capillary: 195 mg/dL — ABNORMAL HIGH (ref 70–99)

## 2024-04-06 NOTE — Progress Notes (Signed)
 Physical Therapy Treatment Patient Details Name: Mathew Porter MRN: [id number] DOB: 02-22-1956 Today's Date: 04/06/2024   History of Present Illness Kayston Jodoin comes to [facility] ED on 04/02/24 after a fall. Pt is s/p Rt TKA 03/04/24. PMH: DM2, seizure-like activity, BPH, HTN, OSA. Workup revealing of quads tendon rupture. Surgical repair was done by Dr. Edie on 8/14, pt places in a long leg cast to protect tissue repair.    PT Comments  Pt in bed, he reports some achiness in the surgical side knee during all mobilities. Nurse contacted prior to treatment for pain meds but pt. denied. Pt preference less walking during yesterday's visit due to cast digging into the back of his leg. Pt states that today is slightly better after towel was placed between the cast and posterior thigh.Pt assists with Left shoe donning. Pt requires some assist with navigating leg on floor due to friction during mobility. Pt needs minA to rise to standing with elevated bed height and required Max A x 2 which progressed to maxA x 1 from chair. Pt able to take a few steps today to step  transfer. MD contacted for trapeze to assist pt. With bed mobility. Pt left up to chair, all needs met. RN made aware of request for pain meds.     If plan is discharge home, recommend the following: A lot of help with walking and/or transfers;Assistance with cooking/housework;Help with stairs or ramp for entrance;Assist for transportation   Can travel by private vehicle     No  Equipment Recommendations  None recommended by PT    Recommendations for Other Services       Precautions / Restrictions Restrictions Weight Bearing Restrictions Per Provider Order: Yes RLE Weight Bearing Per Provider Order: Weight bearing as tolerated     Mobility  Bed Mobility Overal bed mobility: Needs Assistance Bed Mobility: Supine to Sit     Supine to sit: Min assist, HOB elevated Sit to supine: Mod assist   General bed mobility comments:  Sliding RLE over to the EOB    Transfers Overall transfer level: Needs assistance   Transfers: Sit to/from Stand, Bed to chair/wheelchair/BSC Sit to Stand: From elevated surface, Min assist   Step pivot transfers: Contact guard assist       General transfer comment: Took steps towards the recliner and bed with CGA+BRW, cuing for technique; manual assistance provided to reposition leg brace    Ambulation/Gait Ambulation/Gait assistance: Min assist   Assistive device: Rolling walker (2 wheels) Gait Pattern/deviations: Step-to pattern Gait velocity: straight plane with chair follow; increasing discomfort with long leg cast into thigh/hip these symptoms present during today's treatment session as well     General Gait Details: placed in surgical shoe, prior to session   Stairs             Wheelchair Mobility     Tilt Bed    Modified Rankin (Stroke Patients Only)       Balance Overall balance assessment: Needs assistance Sitting-balance support: Feet unsupported, Single extremity supported Sitting balance-Leahy Scale: Good Sitting balance - Comments: Steady reaching within BOS   Standing balance support: Bilateral upper extremity supported, Reliant on assistive device for balance, During functional activity Standing balance-Leahy Scale: Poor Standing balance comment: Reliant on RW to off load RLE                            Communication    Cognition Arousal: Alert Behavior  During Therapy: WFL for tasks assessed/performed   PT - Cognitive impairments: No apparent impairments                                Cueing    Exercises Other Exercises Other Exercises: Role of PT, safe ADL completion, discharge planning, DME requirments Other Exercises: after several attempts pt. was not able to stand from chair in room less than max A x 2 Other Exercises: pt able to stand from bed after increasing bed height 6 in x3; pt able to progress to  minA x1 with RW    General Comments        Pertinent Vitals/Pain Pain Assessment Pain Location: achiness in Rt knee; proximal brace scapping against posterior thigh Pain Descriptors / Indicators: Aching    Home Living                          Prior Function            PT Goals (current goals can now be found in the care plan section) Acute Rehab PT Goals Patient Stated Goal: regain mod independent AMB c RW PT Goal Formulation: With patient Time For Goal Achievement: 04/18/24 Potential to Achieve Goals: Good Progress towards PT goals: Progressing toward goals    Frequency    Min 2X/week      PT Plan      Co-evaluation              AM-PAC PT 6 Clicks Mobility   Outcome Measure  Help needed turning from your back to your side while in a flat bed without using bedrails?: A Lot Help needed moving from lying on your back to sitting on the side of a flat bed without using bedrails?: A Lot Help needed moving to and from a bed to a chair (including a wheelchair)?: A Lot Help needed standing up from a chair using your arms (e.g., wheelchair or bedside chair)?: A Lot Help needed to walk in hospital room?: A Little Help needed climbing 3-5 steps with a railing? : A Lot 6 Click Score: 13    End of Session   Activity Tolerance: Patient tolerated treatment well;No increased pain;Patient limited by fatigue Patient left: in bed;with call bell/phone within reach;with family/visitor present Nurse Communication: Mobility status PT Visit Diagnosis: Difficulty in walking, not elsewhere classified (R26.2);Other abnormalities of gait and mobility (R26.89);Repeated falls (R29.6);Muscle weakness (generalized) (M62.81)     Time: 8897-8846 PT Time Calculation (min) (ACUTE ONLY): 51 min  Charges:    $Therapeutic Activity: 23-37 mins                       Sherlean Lesches DPT, PT     Shay Bartoli A Trease Bremner 04/06/2024, 1:15 PM

## 2024-04-06 NOTE — Progress Notes (Signed)
 PROGRESS NOTE    Mathew Porter  FMW:[id number] DOB: 1956/05/22 DOA: 04/02/2024 PCP: Valora Agent, MD  Outpatient Specialists: orthopedics    Brief Narrative:   From admission h and p Mathew Porter is a 68 y.o. male with medical history significant of Anemia, Anxiety, Asthma, DmII, HA , HTN,obesity, OSA, in addition to interim hx of R TKA Dr. Edie  on 03/04/2024.  Patient presents to ED s/p mechanical fall with injury to right Knee. Patient notes no other injury. Denies head strike. Patient on ros noted no n/v/d/sob/chest pain / abdominal pain / fever/ or chills. Notes he was at his baseline prior to fall. Patient states he believes he slipped while walking. He notes is pain is a 2 at rest but increase to a max of 10 with  movement.     Assessment & Plan:   Principal Problem:   Quadriceps tendon rupture Active Problems:   Hypertension   Morbid obesity with BMI of 40.0-44.9, adult (HCC)   Diabetes (HCC)  # Right quadriceps tendon rupture After trip and fall, in setting of recent right TKA. Now pos-op from primary repair of quadriceps tendon rupture and medial retinacula dehiscence on 8/14 - pain control, bowel regimen - [facility] f/u 2 weeks - no showering - WBAT - PT advising snf, toc consulted, bed search underway  # Constipation BM on 8/16 - cont miralax  - cont nightly senna  # s/p right total knee replacement Last month - outpt f/u  # Seizure disorder Stable - home lamictal   # T2DM Glucose appropriate - SSI  # HTN Bp controlled - home verapamil , statin  # Obesity Noted  # OSA - cpap qhs   DVT prophylaxis: apixaban  Code Status: full Family Communication: wife at bedside 8/16  Level of care: Med-Surg Status is: Inpatient Remains inpatient appropriate because: pending snf placement    Consultants:  orthopedics  Procedures: See above     Subjective: Reports stable right leg pain, mild and controlled. BM  yesterday  Objective: Vitals:   04/05/24 1959 04/05/24 2142 04/06/24 0445 04/06/24 0737  BP: 129/68  132/74 (!) 124/100  Pulse:  (!) 105 (!) 104 (!) 103  Resp: 18  16 18   Temp: (!) 97.5 F (36.4 C)  98.1 F (36.7 C) 97.7 F (36.5 C)  TempSrc: Oral   Oral  SpO2: 95% 94% 97% 96%  Weight:      Height:        Intake/Output Summary (Last 24 hours) at 04/06/2024 1516 Last data filed at 04/06/2024 1300 Gross per 24 hour  Intake 960 ml  Output 1950 ml  Net -990 ml   Filed Weights   04/02/24 1300 04/03/24 1425  Weight: (!) 152 kg (!) 149.7 kg    Examination:  General exam: Appears calm and comfortable  Respiratory system: Clear to auscultation. Respiratory effort normal. Cardiovascular system: S1 & S2 heard, RRR.   Gastrointestinal system: Abdomen is obese, soft and nontender.   Central nervous system: Alert and oriented. No focal neurological deficits. Extremities: right leg is in a cast. Distal sensation inttact Skin: No rashes, lesions or ulcers Psychiatry: Judgement and insight appear normal. Mood & affect appropriate.     Data Reviewed: I have personally reviewed following labs and imaging studies  CBC: Recent Labs  Lab 04/02/24 1645 04/03/24 0551 04/04/24 0418 04/05/24 0334  WBC 13.7* 8.5 8.3 8.3  NEUTROABS 10.9*  --   --   --   HGB 14.3 12.2* 12.6* 11.9*  HCT  43.8 37.5* 38.7* 37.7*  MCV 90.9 91.0 91.5 92.6  PLT 396 316 316 308   Basic Metabolic Panel: Recent Labs  Lab 04/02/24 1645 04/03/24 0551 04/04/24 0418  NA 134* 135 136  K 4.3 4.1 4.6  CL 101 101 103  CO2 21* 26 25  GLUCOSE 164* 140* 159*  BUN 15 18 12   CREATININE 0.85 0.85 0.71  CALCIUM  9.1 8.5* 8.4*   GFR: Estimated Creatinine Clearance: 133.2 mL/min (by C-G formula based on SCr of 0.71 mg/dL). Liver Function Tests: Recent Labs  Lab 04/02/24 1645 04/03/24 0551  AST 22 18  ALT 21 17  ALKPHOS 82 68  BILITOT 0.8 0.8  PROT 7.6 6.6  ALBUMIN 3.8 3.1*   No results for input(s):  LIPASE, AMYLASE in the last 168 hours. No results for input(s): AMMONIA in the last 168 hours. Coagulation Profile: No results for input(s): INR, PROTIME in the last 168 hours. Cardiac Enzymes: No results for input(s): CKTOTAL, CKMB, CKMBINDEX, TROPONINI in the last 168 hours. BNP (last 3 results) No results for input(s): PROBNP in the last 8760 hours. HbA1C: No results for input(s): HGBA1C in the last 72 hours.  CBG: Recent Labs  Lab 04/05/24 1146 04/05/24 1634 04/05/24 2130 04/06/24 0738 04/06/24 1105  GLUCAP 107* 122* 138* 129* 116*   Lipid Profile: No results for input(s): CHOL, HDL, LDLCALC, TRIG, CHOLHDL, LDLDIRECT in the last 72 hours. Thyroid Function Tests: No results for input(s): TSH, T4TOTAL, FREET4, T3FREE, THYROIDAB in the last 72 hours. Anemia Panel: No results for input(s): VITAMINB12, FOLATE, FERRITIN, TIBC, IRON, RETICCTPCT in the last 72 hours. Urine analysis:    Component Value Date/Time   COLORURINE YELLOW (A) 02/21/2024 1356   APPEARANCEUR CLEAR (A) 02/21/2024 1356   APPEARANCEUR Clear 05/23/2018 1043   LABSPEC 1.020 02/21/2024 1356   PHURINE 5.0 02/21/2024 1356   GLUCOSEU NEGATIVE 02/21/2024 1356   HGBUR NEGATIVE 02/21/2024 1356   BILIRUBINUR NEGATIVE 02/21/2024 1356   BILIRUBINUR Negative 05/23/2018 1043   KETONESUR NEGATIVE 02/21/2024 1356   PROTEINUR NEGATIVE 02/21/2024 1356   NITRITE NEGATIVE 02/21/2024 1356   LEUKOCYTESUR NEGATIVE 02/21/2024 1356   Sepsis Labs: @LABRCNTIP (procalcitonin:4,lacticidven:4)  )No results found for this or any previous visit (from the past 240 hours).       Radiology Studies: No results found.       Scheduled Meds:  apixaban   2.5 mg Oral BID   docusate sodium   100 mg Oral BID   insulin  aspart  0-20 Units Subcutaneous TID WC   lamoTRIgine   100 mg Oral QHS   polyethylene glycol  34 g Oral Daily   senna  1 tablet Oral Daily   simvastatin   20  mg Oral QHS   verapamil   240 mg Oral QPM   Continuous Infusions:     LOS: 4 days     Mathew KATHEE Ban, MD [facility]   If 7PM-7AM, please contact night-coverage [email or web address] Password [facility] 04/06/2024, 3:16 PM

## 2024-04-06 NOTE — Plan of Care (Signed)

## 2024-04-06 NOTE — Progress Notes (Signed)
  Subjective: 3 Days Post-Op Procedure(s) (LRB): REPAIR, TENDON, QUADRICEPS (Right) Patient reports pain as mild.   Patient is well, and has had no acute complaints or problems Plan is to go Home after hospital stay. Negative for chest pain and shortness of breath Fever: no Gastrointestinal: Negative for nausea and vomiting TOC looking for placement to SNF  Objective: Vital signs in last 24 hours: Temp:  [97.5 F (36.4 C)-98.6 F (37 C)] 97.7 F (36.5 C) (08/17 0737) Pulse Rate:  [103-111] 103 (08/17 0737) Resp:  [16-18] 18 (08/17 0737) BP: (124-132)/(61-100) 124/100 (08/17 0737) SpO2:  [94 %-97 %] 96 % (08/17 0737)  Intake/Output from previous day:  Intake/Output Summary (Last 24 hours) at 04/06/2024 1217 Last data filed at 04/06/2024 0900 Gross per 24 hour  Intake 960 ml  Output 1950 ml  Net -990 ml    Intake/Output this shift: Total I/O In: 240 [P.O.:240] Out: 300 [Urine:300]  Labs: Recent Labs    04/04/24 0418 04/05/24 0334  HGB 12.6* 11.9*   Recent Labs    04/04/24 0418 04/05/24 0334  WBC 8.3 8.3  RBC 4.23 4.07*  HCT 38.7* 37.7*  PLT 316 308   Recent Labs    04/04/24 0418  NA 136  K 4.6  CL 103  CO2 25  BUN 12  CREATININE 0.71  GLUCOSE 159*  CALCIUM  8.4*   No results for input(s): LABPT, INR in the last 72 hours.   EXAM General - Patient is Alert and Oriented Extremity - Neurovascular intact Sensation intact distally Dorsiflexion/Plantar flexion intact Dressing/Incision -the long-leg cast is in place and fitting well, with added towel or cloth to insulate proximal portion of cast Motor Function - intact, moving toes well on exam.   Past Medical History:  Diagnosis Date   Anemia    Anxiety    Asthma    Cellulitis    Diabetes mellitus without complication (HCC)    Dyspnea    Headache    HTN (hypertension)    Obesity    Pneumonia    Primary osteoarthritis of right knee    Pyogenic granuloma of skin    Sleep apnea      Assessment/Plan: 3 Days Post-Op Procedure(s) (LRB): REPAIR, TENDON, QUADRICEPS (Right) Principal Problem:   Quadriceps tendon rupture Active Problems:   Hypertension   Morbid obesity with BMI of 40.0-44.9, adult (HCC)   Diabetes (HCC)  Estimated body mass index is 46.03 kg/m as calculated from the following:   Height as of this encounter: 5' 11 (1.803 m).   Weight as of this encounter: 149.7 kg. Advance diet Up with therapy  Discharge planning: Discharge pending placement - TOC looking for SNF Follow-up with [facility] orthopedics in 2 weeks for cast and staple removal No showering.  Cast in place - good cast care.  Labs and VSS. Most recent Hgb at 11.9  DVT Prophylaxis - Eliquis  Weight-Bearing as tolerated to right leg with cast shoe as dicussed with PT  Fonda CHARLENA Koyanagi, PA-C Orthopaedic Surgery 04/06/2024, 12:17 PM

## 2024-04-06 NOTE — Plan of Care (Signed)
   Problem: Coping: Goal: Ability to adjust to condition or change in health will improve Outcome: Progressing   Problem: Metabolic: Goal: Ability to maintain appropriate glucose levels will improve Outcome: Progressing   Problem: Education: Goal: Knowledge of General Education information will improve Description: Including pain rating scale, medication(s)/side effects and non-pharmacologic comfort measures Outcome: Progressing

## 2024-04-07 DIAGNOSIS — S76111A Strain of right quadriceps muscle, fascia and tendon, initial encounter: Secondary | ICD-10-CM | POA: Diagnosis not present

## 2024-04-07 LAB — GLUCOSE, CAPILLARY
Glucose-Capillary: 153 mg/dL — ABNORMAL HIGH (ref 70–99)
Glucose-Capillary: 116 mg/dL — ABNORMAL HIGH (ref 70–99)
Glucose-Capillary: 169 mg/dL — ABNORMAL HIGH (ref 70–99)
Glucose-Capillary: 219 mg/dL — ABNORMAL HIGH (ref 70–99)

## 2024-04-07 NOTE — Progress Notes (Signed)
  Progress Note   Patient: Mathew Porter FMW:[id number] DOB: [birth year] DOA: 04/02/2024     5 DOS: the patient was seen and examined on 04/07/2024 at 9:31AM      Brief hospital course: 68 y.o. M with MO, DM, seizures, OSA, HTN, asthma, anxiety and recent knee replacement who presented after a fall, found to have quadriceps rupture.  Admitted to hospitalist service, taken to OR by Dr. Edie on 8/14 for primary repair of quad tendon rupture and medial retinacular dehiscence of the right knee.  Post-op course uncomplicated.     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  Postop, fell and developed a quadriceps tendon rupture and medial retinacular dehiscence.  Underwent repair on 8/14 by Dr. Edie -Follow-up with [facility] orthopedics after discharge - WBAT -Low-dose Eliquis  for DVT prophylaxis   Morbid obesity BMI 46, complicates care  Seizure disorder No seizures here - Continue Lamictal   Type 2 diabetes Glucose at goal -Continue simvastatin  - Continue sliding scale correction insulin  - Hold home glipizide , Mounjaro, metformin   Hypertension BP controlled -Continue verapamil   Sleep apnea This is an old diagnosis, patient has not used CPAP in years         Subjective: Patient is doing well, he is awaiting SNF.  He has no new complaints or concerns.  No nursing concerns.     Physical Exam: BP 130/83 (BP Location: Left Arm)   Pulse (!) 105   Temp 98.6 F (37 C) (Oral)   Resp 16   Ht 5' 11 (1.803 m)   Wt (!) 149.7 kg   SpO2 97%   BMI 46.03 kg/m   Adult male, lying in bed, interactive and appropriate Tachycardic, regular, no murmurs, no peripheral edema, JVP not visible due to body habitus Respiratory rate normal, lungs clear without rales or wheezes Abdomen soft, no tenderness palpation Right leg is in a cast Attention normal, affect  normal, upper extremity strength appears normal   Data Reviewed: No new labs    Family Communication: None present    Disposition: Status is: Inpatient Patient was admitted with tendon rupture after recent knee replacement.  He is doing well postop, has mild tachycardia due to pain, but not able to walk.  Stable for discharge to SNF whenever a bed is available        Author: Lonni SHAUNNA Dalton, MD 04/07/2024 2:31 PM  For on call review [email or web address].

## 2024-04-07 NOTE — Hospital Course (Signed)
 68 y.o. M with MO, DM, seizures, OSA, HTN, asthma, anxiety and recent knee replacement who presented after a fall, found to have quadriceps rupture.  Admitted to hospitalist service, taken to OR by Dr. Edie on 8/14 for primary repair of quad tendon rupture and medial retinacular dehiscence of the right knee.  Post-op course uncomplicated.

## 2024-04-07 NOTE — Progress Notes (Signed)
   Subjective: 4 Days Post-Op Procedure(s) (LRB): REPAIR, TENDON, QUADRICEPS (Right) Patient reports pain as mild.   Patient is well, and has had no acute complaints or problems Denies any CP, SOB, ABD pain. We will continue therapy today.  Plan is to go Skilled nursing facility after hospital stay.  Objective: Vital signs in last 24 hours: Temp:  [97.8 F (36.6 C)-98.6 F (37 C)] 98.6 F (37 C) (08/18 0827) Pulse Rate:  [91-110] 105 (08/18 0827) Resp:  [16-20] 16 (08/18 0827) BP: (129-138)/(68-85) 130/83 (08/18 0827) SpO2:  [94 %-97 %] 97 % (08/18 0827)  Intake/Output from previous day: 08/17 0701 - 08/18 0700 In: 1080 [P.O.:1080] Out: 3175 [Urine:2975; Stool:200] Intake/Output this shift: Total I/O In: -  Out: 300 [Urine:300]  Recent Labs    04/05/24 0334  HGB 11.9*   Recent Labs    04/05/24 0334  WBC 8.3  RBC 4.07*  HCT 37.7*  PLT 308   No results for input(s): NA, K, CL, CO2, BUN, CREATININE, GLUCOSE, CALCIUM  in the last 72 hours. No results for input(s): LABPT, INR in the last 72 hours.  EXAM General - Patient is Alert, Appropriate, and Oriented Extremity - Neurovascular intact Sensation intact distally No cellulitis present Compartment soft Cast intact, no skin breakdown noted Dressing - dressing C/D/I Motor Function - intact, moving toes well on exam.   Past Medical History:  Diagnosis Date   Anemia    Anxiety    Asthma    Cellulitis    Diabetes mellitus without complication (HCC)    Dyspnea    Headache    HTN (hypertension)    Obesity    Pneumonia    Primary osteoarthritis of right knee    Pyogenic granuloma of skin    Sleep apnea     Assessment/Plan:   4 Days Post-Op Procedure(s) (LRB): REPAIR, TENDON, QUADRICEPS (Right) Principal Problem:   Quadriceps tendon rupture Active Problems:   Hypertension   Morbid obesity with BMI of 40.0-44.9, adult (HCC)   Diabetes (HCC)  Estimated body mass index is 46.03  kg/m as calculated from the following:   Height as of this encounter: 5' 11 (1.803 m).   Weight as of this encounter: 149.7 kg. Up with therapy, WBAT RLE Pain well controlled VSS CM to assist with discharge to SNF. Follow up with [facility] in 2 weeks. Keep cast clean and dry at all times.   DVT Prophylaxis - Eliquis  Weight-Bearing as tolerated to right leg   T. Medford Amber, PA-C [facility] 04/07/2024, 9:17 AM

## 2024-04-07 NOTE — Plan of Care (Signed)
  Problem: Education: Goal: Ability to describe self-care measures that may prevent or decrease complications (Diabetes Survival Skills Education) will improve Outcome: Progressing   Problem: Pain Managment: Goal: General experience of comfort will improve and/or be controlled Outcome: Progressing   Problem: Safety: Goal: Ability to remain free from injury will improve Outcome: Progressing

## 2024-04-07 NOTE — TOC Progression Note (Addendum)
 Transition of Care [facility]) - Progression Note    Patient Details  Name: Mathew Porter MRN: [id number] Date of Birth: 10-Dec-1955  [facility]) CM/SW Contact  Alvaro Louder, [place] Phone Number: 04/07/2024, 3:18 PM  Clinical Narrative:   LCSWA met with patient at the bedside. Patient indicated that he wanted to go to [facility] for rehab. Firefighter for patient has been approved.    SNF approved 04/07/24-04/09/24 next review date 04/09/24    TOC to follow for discharge                    Expected Discharge Plan and Services                                               Social Drivers of Health (SDOH) Interventions SDOH Screenings   Food Insecurity: No Food Insecurity (04/02/2024)  Housing: Low Risk  (04/02/2024)  Transportation Needs: No Transportation Needs (04/02/2024)  Utilities: Not At Risk (04/02/2024)  Financial Resource Strain: Low Risk  (03/19/2024)   Received from [facility]  Social Connections: Moderately Isolated (04/02/2024)  Tobacco Use: Medium Risk (04/03/2024)    Readmission Risk Interventions     No data to display

## 2024-04-08 DIAGNOSIS — E119 Type 2 diabetes mellitus without complications: Secondary | ICD-10-CM | POA: Diagnosis not present

## 2024-04-08 DIAGNOSIS — S76111A Strain of right quadriceps muscle, fascia and tendon, initial encounter: Secondary | ICD-10-CM | POA: Diagnosis not present

## 2024-04-08 DIAGNOSIS — I10 Essential (primary) hypertension: Secondary | ICD-10-CM | POA: Diagnosis not present

## 2024-04-08 DIAGNOSIS — Z6841 Body Mass Index (BMI) 40.0 and over, adult: Secondary | ICD-10-CM

## 2024-04-08 LAB — CBC
HCT: 42.1 % (ref 39.0–52.0)
Hemoglobin: 12.9 g/dL — ABNORMAL LOW (ref 13.0–17.0)
MCH: 29.3 pg (ref 26.0–34.0)
MCHC: 30.6 g/dL (ref 30.0–36.0)
MCV: 95.5 fL (ref 80.0–100.0)
Platelets: 331 K/uL (ref 150–400)
RBC: 4.41 MIL/uL (ref 4.22–5.81)
RDW: 14.7 % (ref 11.5–15.5)
WBC: 9.4 K/uL (ref 4.0–10.5)
nRBC: 0 % (ref 0.0–0.2)

## 2024-04-08 LAB — GLUCOSE, CAPILLARY
Glucose-Capillary: 229 mg/dL — ABNORMAL HIGH (ref 70–99)
Glucose-Capillary: 121 mg/dL — ABNORMAL HIGH (ref 70–99)

## 2024-04-08 LAB — BASIC METABOLIC PANEL WITH GFR
Anion gap: 9 (ref 5–15)
BUN: 14 mg/dL (ref 8–23)
CO2: 27 mmol/L (ref 22–32)
Calcium: 9.1 mg/dL (ref 8.9–10.3)
Chloride: 98 mmol/L (ref 98–111)
Creatinine, Ser: 0.6 mg/dL — ABNORMAL LOW (ref 0.61–1.24)
GFR, Estimated: >60 mL/min (ref >60)
Glucose, Bld: 155 mg/dL — ABNORMAL HIGH (ref 70–99)
Potassium: 4.1 mmol/L (ref 3.5–5.1)
Sodium: 134 mmol/L — ABNORMAL LOW (ref 135–145)

## 2024-04-08 MED ORDER — DOCUSATE SODIUM 100 MG PO CAPS: 100.0000 mg | ORAL_CAPSULE | Freq: Two times a day (BID) | ORAL | 0 refills | Status: AC

## 2024-04-08 MED ORDER — BISACODYL 10 MG RE SUPP: 10.0000 mg | Freq: Every day | RECTAL | 0 refills | Status: AC | PRN

## 2024-04-08 MED ORDER — VITAMIN B-12 1000 MCG PO TABS
1000.0000 ug | ORAL_TABLET | Freq: Every day | ORAL | Status: DC
  Administered 2024-04-08: 1000 ug via ORAL
  Filled 2024-04-08: qty 1

## 2024-04-08 MED ORDER — GLIPIZIDE ER 2.5 MG PO TB24
2.5000 mg | ORAL_TABLET | Freq: Every day | ORAL | Status: DC
  Administered 2024-04-08: 2.5 mg via ORAL
  Filled 2024-04-08: qty 1

## 2024-04-08 MED ORDER — VITAMIN D 25 MCG (1000 UNIT) PO TABS
1000.0000 [IU] | ORAL_TABLET | Freq: Every day | ORAL | Status: DC
  Administered 2024-04-08: 1000 [IU] via ORAL
  Filled 2024-04-08: qty 1

## 2024-04-08 MED ORDER — METFORMIN HCL ER 500 MG PO TB24
1000.0000 mg | ORAL_TABLET | Freq: Every day | ORAL | Status: DC
  Filled 2024-04-08: qty 2

## 2024-04-08 NOTE — Progress Notes (Signed)
 Subjective: 5 Days Post-Op Procedure(s) (LRB): REPAIR, TENDON, QUADRICEPS (Right) Patient reports pain as mild.   Patient is well, and has had no acute complaints or problems Denies any CP, SOB, ABD pain. We will continue therapy today.  Plan is to go Skilled nursing facility after hospital stay.  Objective: Vital signs in last 24 hours: Temp:  [97.7 F (36.5 C)-98.6 F (37 C)] 97.7 F (36.5 C) (08/19 0342) Pulse Rate:  [96-109] 96 (08/19 0342) Resp:  [16-18] 18 (08/19 0342) BP: (116-158)/(59-92) 158/77 (08/19 0342) SpO2:  [94 %-98 %] 98 % (08/19 0342)  Intake/Output from previous day: 08/18 0701 - 08/19 0700 In: 480 [P.O.:480] Out: 800 [Urine:800] Intake/Output this shift: No intake/output data recorded.  Recent Labs    04/08/24 0420  HGB 12.9*   Recent Labs    04/08/24 0420  WBC 9.4  RBC 4.41  HCT 42.1  PLT 331   Recent Labs    04/08/24 0420  NA 134*  K 4.1  CL 98  CO2 27  BUN 14  CREATININE 0.60*  GLUCOSE 155*  CALCIUM  9.1   No results for input(s): LABPT, INR in the last 72 hours.  EXAM General - Patient is Alert, Appropriate, and Oriented Extremity - Neurovascular intact Sensation intact distally No cellulitis present Compartment soft Cast intact, no skin breakdown noted Dressing - dressing C/D/I Motor Function - intact, moving toes well on exam.   Past Medical History:  Diagnosis Date   Anemia    Anxiety    Asthma    Cellulitis    Diabetes mellitus without complication (HCC)    Dyspnea    Headache    HTN (hypertension)    Obesity    Pneumonia    Primary osteoarthritis of right knee    Pyogenic granuloma of skin    Sleep apnea     Assessment/Plan:   5 Days Post-Op Procedure(s) (LRB): REPAIR, TENDON, QUADRICEPS (Right) Principal Problem:   Quadriceps tendon rupture Active Problems:   Hypertension   Morbid obesity with BMI of 40.0-44.9, adult (HCC)   Diabetes (HCC)  Estimated body mass index is 46.03 kg/m as calculated  from the following:   Height as of this encounter: 5' 11 (1.803 m).   Weight as of this encounter: 149.7 kg. Up with therapy, WBAT RLE Pain well controlled VSS CM to assist with discharge to SNF. Follow up with [facility] in 7-10 days. Keep cast clean and dry at all times.  DVT Prophylaxis - Eliquis  Weight-Bearing as tolerated to right leg  J. Gustavo Level, PA-C, CAQ-OS [facility] 04/08/2024, 7:50 AM

## 2024-04-08 NOTE — Plan of Care (Signed)
   Problem: Education: Goal: Ability to describe self-care measures that may prevent or decrease complications (Diabetes Survival Skills Education) will improve Outcome: Progressing Goal: Individualized Educational Video(s) Outcome: Progressing

## 2024-04-08 NOTE — Discharge Summary (Addendum)
 Physician Discharge Summary   Patient: Mathew Porter MRN: [id number] DOB: [birth year]  Admit date:     04/02/2024  Discharge date: 04/08/24  Discharge Physician: Leita Blanch   PCP: Valora Agent, MD   Recommendations at discharge:   F/u Agent Level in 2 weeks for staple removal F/u pcp in 1-2 weeks  Discharge Diagnoses: Principal Problem:   Quadriceps tendon rupture Active Problems:   Hypertension   Morbid obesity with BMI of 40.0-44.9, adult (HCC)   Diabetes (HCC)  68 y.o. M with MO, DM, seizures, OSA, HTN, asthma, anxiety and recent knee replacement who presented after a fall, found to have quadriceps rupture.   Admitted to hospitalist service, taken to OR by Dr. Edie on 8/14 for primary repair of quadriceps tendon rupture and medial retinacular dehiscence of the right knee.  Post-op course uncomplicated.   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  Postop, fell and developed a quadriceps tendon rupture and medial retinacular dehiscence.  Underwent repair on 8/14 by Dr. Edie -Follow-up with [facility] orthopedics after discharge in 2 weeks for staple removal - WBAT -Low-dose Eliquis  for DVT prophylaxis for 14 days per ortho end date 8/29    Morbid obesity BMI 46, complicates care   Seizure disorder No seizures here - Continue Lamictal    Type 2 diabetes Glucose at goal -Continue simvastatin  - Continue sliding scale correction insulin  - resumed home glipizide , Mounjaro, metformin --since sugars now in the 200's --A1c 6.6   Hypertension BP controlled -Continue verapamil    Sleep apnea This is an old diagnosis, patient has not used CPAP in years due to unable to tolerate it   Overall stable and will d/c to [facility] today. Wife is aware per pt           Pain control - Hurt  Controlled Substance Reporting System database was  reviewed. and patient was instructed, not to drive, operate heavy machinery, perform activities at heights, swimming or participation in water activities or provide baby-sitting services while on Pain, Sleep and Anxiety Medications; until their outpatient Physician has advised to do so again. Also recommended to not to take more than prescribed Pain, Sleep and Anxiety Medications.  Consultants: Ortho Procedures performed:        Disposition: Rehabilitation facility Diet recommendation:  Discharge Diet Orders (From admission, onward)     Start     Ordered   04/08/24 0000  Diet - low sodium heart healthy        04/08/24 0956           Cardiac and Carb modified diet DISCHARGE MEDICATION: Allergies as of 04/08/2024       Reactions   Doxycycline  Hives, Itching        Medication List     STOP taking these medications    aspirin -acetaminophen -caffeine  250-250-65 MG tablet Commonly known as: EXCEDRIN  MIGRAINE       TAKE these medications    apixaban  2.5 MG Tabs tablet Commonly known as: Eliquis  Take 1 tablet (2.5 mg total) by mouth 2 (two) times daily for 14 days.   bisacodyl  10 MG suppository Commonly known as: DULCOLAX Place 1 suppository (10 mg total) rectally daily as needed for moderate constipation.   chlorhexidine  4 % external liquid Commonly known as: HIBICLENS  Apply 15 mLs (1 Application total) topically as directed for 30 doses. Use as directed daily for 5 days every other week for 6 weeks.  What changed: Another medication with the same name was removed. Continue taking this medication, and follow the directions you see here.   cholecalciferol  25 MCG (1000 UNIT) tablet Commonly known as: VITAMIN D3 Take 1,000 Units by mouth daily.   cyanocobalamin  1000 MCG tablet Commonly known as: VITAMIN B12 Take 1,000 mcg by mouth daily.   docusate sodium  100 MG capsule Commonly known as: COLACE Take 1 capsule (100 mg total) by mouth 2 (two) times daily.   glipiZIDE   2.5 MG 24 hr tablet Commonly known as: GLUCOTROL  XL Take 2.5 mg by mouth daily.   lamoTRIgine  100 MG 24 hour tablet Commonly known as: LAMICTAL  XR Take 100 mg by mouth at bedtime.   metFORMIN  500 MG 24 hr tablet Commonly known as: GLUCOPHAGE -XR Take 1,000 mg by mouth daily with supper.   Mounjaro 10 MG/0.5ML Pen Generic drug: tirzepatide Inject 10 mg into the skin every Friday.   mupirocin  ointment 2 % Commonly known as: BACTROBAN  Place 1 Application into the nose 2 (two) times daily for 60 doses. Use as directed 2 times daily for 5 days every other week for 6 weeks. What changed: Another medication with the same name was removed. Continue taking this medication, and follow the directions you see here.   ondansetron  4 MG disintegrating tablet Commonly known as: ZOFRAN -ODT Take 1 tablet (4 mg total) by mouth every 8 (eight) hours as needed for nausea or vomiting.   oxyCODONE  5 MG immediate release tablet Commonly known as: Roxicodone  Take 1 tablet (5 mg total) by mouth every 4 (four) hours as needed for moderate pain (pain score 4-6) or severe pain (pain score 7-10). What changed: how much to take   simvastatin  20 MG tablet Commonly known as: ZOCOR  Take 20 mg by mouth at bedtime.   SUMAtriptan  50 MG tablet Commonly known as: IMITREX  Take 1 tablet (50 mg total) by mouth 3 (three) times daily as needed for migraine or headache. May repeat in 2 hours if headache persists or recurs.   verapamil  240 MG CR tablet Commonly known as: CALAN -SR Take 240 mg by mouth every evening.               Discharge Care Instructions  (From admission, onward)           Start     Ordered   04/08/24 0000  Discharge wound care:       Comments: Until discontinued   04/08/24 9043            Contact information for follow-up providers     Kip Lynwood Double, PA-C Follow up in 2 week(s).   Specialty: Physician Assistant Why: For staple removal Contact information: [address] [place] [address] [phone]         Valora Lynwood, MD. Schedule an appointment as soon as possible for a visit in 1 week(s).   Specialty: Family Medicine Contact information: [address] [facility] [place] [address] [phone]              Contact information for after-discharge care     Destination     [facility] .   Service: Skilled Nursing Contact information: [address] [place]  [address] [phone]                    Discharge Exam: Fredricka Weights   04/02/24 1300 04/03/24 1425  Weight: (!) 152 kg (!) 149.7  kg   Alert and ox3 Resp: CTA CV s1 s2 normal Right LE cast +  Condition at discharge: fair  The results of significant diagnostics from this hospitalization (including imaging, microbiology, ancillary and laboratory) are listed below for reference.   Imaging Studies: DG Chest Port 1 View Result Date: 04/02/2024 CLINICAL DATA:  Leukocytosis EXAM: PORTABLE CHEST 1 VIEW COMPARISON:  11/07/2018 FINDINGS: Cardiac shadow is within normal limits. The lungs are well aerated bilaterally. No focal infiltrate or sizable effusion is noted. The bony structures are within normal limits. IMPRESSION: No active disease. Electronically Signed   By: Oneil Devonshire M.D.   On: 04/02/2024 22:21   US  Venous Img Lower Unilateral Right Result Date: 04/02/2024 CLINICAL DATA:  Right leg swelling EXAM: RIGHT LOWER EXTREMITY VENOUS DOPPLER ULTRASOUND TECHNIQUE: Gray-scale sonography with graded compression, as well as color Doppler and duplex ultrasound were performed to evaluate the lower extremity deep venous systems from the level of the common femoral vein and including the common femoral, femoral, profunda femoral, popliteal and calf veins including the posterior tibial, peroneal and gastrocnemius veins when visible. The superficial great saphenous  vein was also interrogated. Spectral Doppler was utilized to evaluate flow at rest and with distal augmentation maneuvers in the common femoral, femoral and popliteal veins. COMPARISON:  None Available. FINDINGS: Contralateral Common Femoral Vein: Respiratory phasicity is normal and symmetric with the symptomatic side. No evidence of thrombus. Normal compressibility. Common Femoral Vein: No evidence of thrombus. Normal compressibility, respiratory phasicity and response to augmentation. Saphenofemoral Junction: No evidence of thrombus. Normal compressibility and flow on color Doppler imaging. Profunda Femoral Vein: No evidence of thrombus. Normal compressibility and flow on color Doppler imaging. Femoral Vein: No evidence of thrombus. Normal compressibility, respiratory phasicity and response to augmentation. Popliteal Vein: No evidence of thrombus. Normal compressibility, respiratory phasicity and response to augmentation. Calf Veins: No evidence of thrombus. Normal compressibility and flow on color Doppler imaging. Superficial Great Saphenous Vein: No evidence of thrombus. Normal compressibility. Venous Reflux:  None. Other Findings:  None. IMPRESSION: No evidence of deep venous thrombosis within the RIGHT lower extremity. Electronically Signed   By: Suzen Dials M.D.   On: 04/02/2024 19:59   DG Femur Min 2 Views Right Result Date: 04/02/2024 CLINICAL DATA:  Pain after fall. Right femur pain. Fell at home. Knee replacement 03/04/24 EXAM: RIGHT FEMUR 2 VIEWS COMPARISON:  None Available. FINDINGS: No acute fracture. Knee arthroplasty is intact. No periprosthetic lucency. Hip alignment is maintained. There is soft tissue edema about the distal thigh and knee. IMPRESSION: 1. No acute fracture of the right femur. 2. Intact knee arthroplasty. 3. Soft tissue edema about the distal thigh and knee. Electronically Signed   By: Andrea Gasman M.D.   On: 04/02/2024 14:08    Microbiology: Results for orders placed  or performed during the hospital encounter of 02/21/24  Surgical pcr screen     Status: Abnormal   Collection Time: 02/21/24  1:56 PM   Specimen: Nasal Mucosa; Nasal Swab  Result Value Ref Range Status   MRSA, PCR NEGATIVE NEGATIVE Final   Staphylococcus aureus POSITIVE (A) NEGATIVE Final    Comment: (NOTE) The Xpert SA Assay (FDA approved for NASAL specimens in patients 20 years of age and older), is one component of a comprehensive surveillance program. It is not intended to diagnose infection nor to guide or monitor treatment. Performed at [facility], [address] [place], [place] [address]     Labs: CBC: Recent Labs  Lab 04/02/24 1645 04/03/24 0551 04/04/24 0418 04/05/24 0334 04/08/24 0420  WBC 13.7* 8.5 8.3 8.3 9.4  NEUTROABS 10.9*  --   --   --   --   HGB 14.3 12.2* 12.6* 11.9* 12.9*  HCT 43.8 37.5* 38.7* 37.7* 42.1  MCV 90.9 91.0 91.5 92.6 95.5  PLT 396 316 316 308 331   Basic Metabolic Panel: Recent Labs  Lab 04/02/24 1645 04/03/24 0551 04/04/24 0418 04/08/24 0420  NA 134* 135 136 134*  K 4.3 4.1 4.6 4.1  CL 101 101 103 98  CO2 21* 26 25 27   GLUCOSE 164* 140* 159* 155*  BUN 15 18 12 14   CREATININE 0.85 0.85 0.71 0.60*  CALCIUM  9.1 8.5* 8.4* 9.1   Liver Function Tests: Recent Labs  Lab 04/02/24 1645 04/03/24 0551  AST 22 18  ALT 21 17  ALKPHOS 82 68  BILITOT 0.8 0.8  PROT 7.6 6.6  ALBUMIN 3.8 3.1*   CBG: Recent Labs  Lab 04/07/24 0824 04/07/24 1147 04/07/24 1754 04/07/24 2156 04/08/24 0850  GLUCAP 153* 116* 169* 219* 229*    Discharge time spent: greater than 30 minutes.  Signed: Leita Blanch, MD [facility] 04/08/2024

## 2024-04-08 NOTE — TOC Transition Note (Addendum)
 Transition of Care [facility]) - Discharge Note   Patient Details  Name: Mathew Porter MRN: [id number] Date of Birth: 04/14/1956  [facility]) CM/SW Contact:  Alvaro Louder, LCSW Phone Number: 04/08/2024, 12:15 PM   Clinical Narrative:   LCSWA received insurance approval for patient to admit to SNF [facility]. LCSWA confirmed with MD that patient is stable for discharge. LCSWA notified the patient and they are in agreement with discharge. LCSWA confirmed bed is available at SNF. Transport arranged with [facility] for next available.  Number to call report [phone]   TOC Signing off  Final next level of care: Skilled Nursing Facility Barriers to Discharge: No Barriers Identified   Patient Goals and CMS Choice            Discharge Placement              Patient chooses bed at: [facility] Patient to be transferred to facility by: [facility] Name of family member notified: Self Patient and family notified of of transfer: 04/08/24  Discharge Plan and Services Additional resources added to the After Visit Summary for                                       Social Drivers of Health (SDOH) Interventions SDOH Screenings   Food Insecurity: No Food Insecurity (04/02/2024)  Housing: Low Risk  (04/02/2024)  Transportation Needs: No Transportation Needs (04/02/2024)  Utilities: Not At Risk (04/02/2024)  Financial Resource Strain: Low Risk  (03/19/2024)   Received from [facility]  Social Connections: Moderately Isolated (04/02/2024)  Tobacco Use: Medium Risk (04/03/2024)     Readmission Risk Interventions     No data to display

## 2024-04-17 NOTE — Progress Notes (Signed)
 Based on the intraoperative findings and his history, I feel that the right quadriceps tendon rupture is due to his recent right total knee replacement in the setting of mechanical fall.  Thank you!
